# Patient Record
Sex: Female | Born: 2000 | Race: Black or African American | Hispanic: Yes | Smoking: Never smoker
Health system: Southern US, Community
[De-identification: ages and names within clinical notes are randomized; demographics above are authoritative.]

## PROBLEM LIST (undated history)

## (undated) ENCOUNTER — Inpatient Hospital Stay: Payer: Self-pay

## (undated) DIAGNOSIS — F99 Mental disorder, not otherwise specified: Secondary | ICD-10-CM

## (undated) DIAGNOSIS — D649 Anemia, unspecified: Secondary | ICD-10-CM

## (undated) HISTORY — PX: NO PAST SURGERIES: SHX2092

## (undated) HISTORY — DX: Anemia, unspecified: D64.9

## (undated) HISTORY — DX: Mental disorder, not otherwise specified: F99

## (undated) HISTORY — PX: OTHER SURGICAL HISTORY: SHX169

---

## 2021-07-05 ENCOUNTER — Ambulatory Visit (LOCAL_COMMUNITY_HEALTH_CENTER): Payer: Self-pay

## 2021-07-05 ENCOUNTER — Other Ambulatory Visit: Payer: Self-pay

## 2021-07-05 VITALS — BP 118/72 | Ht 63.5 in | Wt 98.0 lb

## 2021-07-05 DIAGNOSIS — Z3201 Encounter for pregnancy test, result positive: Secondary | ICD-10-CM

## 2021-07-05 LAB — PREGNANCY, URINE: Preg Test, Ur: POSITIVE — AB

## 2021-07-05 MED ORDER — PRENATAL 27-0.8 MG PO TABS: 1.0000 | ORAL_TABLET | Freq: Every day | ORAL | 0 refills | Status: DC

## 2021-07-05 NOTE — Progress Notes (Signed)
UPT positive. Plans prenatal care at [facility]. Pt reports moving to [place] from [place] 2 weeks ago with boyfriend and his mother. Reports hx depression, no meds, and no counselor locally. Had counselor in [place]. Hx of cutting self and says she has scars on arms. Hx sucidal attempt one year ago, and says "I cut myself the wrong way." Not hospitalized at that time. Reports last time she cut self was 3 months ago. PHQ 9 today. States suicidal thoughts over last 2 weeks without plan or intent to carry out. Consult Beatris Si, PA who agrees to speak with pt if pt agrees. Advises RN to offer contact card for A Mariana Kaufman, LCSW at [facility] and [employer] crisis hotline card and to have pt agree to call resources before acting on suicidal thoughts. RN offered that a provider could speak with her now, but she declines and states "I'm alright right now." RN carried out provider recommendations and pt agrees to call local behavioral health resources. Pt sent to clerk for preadmit. No NCIR on file. Jerel Shepherd, RN

## 2021-07-05 NOTE — Progress Notes (Signed)
Consulted by RN re: patient situation.  Reviewed RN note and agree that it reflects our discussion and my recommendations. 

## 2021-07-10 ENCOUNTER — Other Ambulatory Visit: Payer: Self-pay

## 2021-07-10 ENCOUNTER — Encounter: Payer: Self-pay | Admitting: Family Medicine

## 2021-07-10 ENCOUNTER — Ambulatory Visit: Payer: Medicaid Other | Admitting: Family Medicine

## 2021-07-10 VITALS — BP 119/73 | HR 85 | Temp 98.0°F | Wt 98.8 lb

## 2021-07-10 DIAGNOSIS — Z3401 Encounter for supervision of normal first pregnancy, first trimester: Secondary | ICD-10-CM

## 2021-07-10 DIAGNOSIS — Z3400 Encounter for supervision of normal first pregnancy, unspecified trimester: Secondary | ICD-10-CM

## 2021-07-10 DIAGNOSIS — Z8659 Personal history of other mental and behavioral disorders: Secondary | ICD-10-CM

## 2021-07-10 DIAGNOSIS — Z87891 Personal history of nicotine dependence: Secondary | ICD-10-CM

## 2021-07-10 DIAGNOSIS — F1910 Other psychoactive substance abuse, uncomplicated: Secondary | ICD-10-CM

## 2021-07-10 LAB — URINALYSIS
Bilirubin, UA: NEGATIVE
Glucose, UA: NEGATIVE
Ketones, UA: NEGATIVE
Leukocytes,UA: NEGATIVE
Nitrite, UA: NEGATIVE
Protein,UA: NEGATIVE
Specific Gravity, UA: 1.03 (ref 1.005–1.030)
Urobilinogen, Ur: 0.2 mg/dL (ref 0.2–1.0)
pH, UA: 6 (ref 5.0–7.5)

## 2021-07-10 LAB — WET PREP FOR TRICH, YEAST, CLUE
Trichomonas Exam: NEGATIVE
Yeast Exam: NEGATIVE

## 2021-07-10 LAB — HEMOGLOBIN, FINGERSTICK: Hemoglobin: 12.3 g/dL (ref 11.1–15.9)

## 2021-07-10 NOTE — Progress Notes (Signed)
UA, Hgb and wet mount results reviewed. Per standing orders no treatment indicated. Sinaya Minogue, RN  

## 2021-07-10 NOTE — Progress Notes (Addendum)
Here today for 11.4 week MH IP. Taking PNV QD. States went to a hospital in [place] one time (06/09/2021) before moving to [facility] 06/17/2021. ROI faxed for records. Tawny Hopping, RN
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
## 2021-07-11 DIAGNOSIS — Z2839 Other underimmunization status: Secondary | ICD-10-CM | POA: Insufficient documentation

## 2021-07-11 DIAGNOSIS — Z3400 Encounter for supervision of normal first pregnancy, unspecified trimester: Secondary | ICD-10-CM | POA: Insufficient documentation

## 2021-07-11 DIAGNOSIS — O09899 Supervision of other high risk pregnancies, unspecified trimester: Secondary | ICD-10-CM | POA: Insufficient documentation

## 2021-07-11 DIAGNOSIS — Z8659 Personal history of other mental and behavioral disorders: Secondary | ICD-10-CM | POA: Insufficient documentation

## 2021-07-11 LAB — CBC/D/PLT+RPR+RH+ABO+RUB AB...
Antibody Screen: NEGATIVE
Basophils Absolute: 0 10*3/uL (ref 0.0–0.2)
Basos: 0 %
EOS (ABSOLUTE): 0 10*3/uL (ref 0.0–0.4)
Eos: 0 %
Hematocrit: 39 % (ref 34.0–46.6)
Hemoglobin: 12.7 g/dL (ref 11.1–15.9)
Hepatitis B Surface Ag: NEGATIVE
Immature Grans (Abs): 0.1 10*3/uL (ref 0.0–0.1)
Immature Granulocytes: 1 %
Lymphocytes Absolute: 1.5 10*3/uL (ref 0.7–3.1)
Lymphs: 12 %
MCH: 29.3 pg (ref 26.6–33.0)
MCHC: 32.6 g/dL (ref 31.5–35.7)
MCV: 90 fL (ref 79–97)
Monocytes Absolute: 0.6 10*3/uL (ref 0.1–0.9)
Monocytes: 5 %
Neutrophils Absolute: 9.8 10*3/uL — ABNORMAL HIGH (ref 1.4–7.0)
Neutrophils: 82 %
Platelets: 441 10*3/uL (ref 150–450)
RBC: 4.34 x10E6/uL (ref 3.77–5.28)
RDW: 14.1 % (ref 11.7–15.4)
RPR Ser Ql: NONREACTIVE
Rh Factor: POSITIVE
Rubella Antibodies, IGG: 6.42 index (ref >0.99)
Varicella zoster IgG: <135 index — ABNORMAL LOW (ref >165)
WBC: 12.1 10*3/uL — ABNORMAL HIGH (ref 3.4–10.8)

## 2021-07-11 LAB — LEAD, BLOOD (ADULT >= 16 YRS): Lead-Whole Blood: <1 ug/dL (ref 0–4)

## 2021-07-11 LAB — HCV INTERPRETATION

## 2021-07-11 LAB — HCV AB W REFLEX TO QUANT PCR: HCV Ab: <0.1 s/co ratio (ref 0.0–0.9)

## 2021-07-12 LAB — 789231 7+OXYCODONE-BUND
Amphetamines, Urine: NEGATIVE ng/mL
BENZODIAZ UR QL: NEGATIVE ng/mL
Barbiturate screen, urine: NEGATIVE ng/mL
Cocaine (Metab.): NEGATIVE ng/mL
OPIATE SCREEN URINE: NEGATIVE ng/mL
Oxycodone/Oxymorphone, Urine: NEGATIVE ng/mL
PCP Quant, Ur: NEGATIVE ng/mL

## 2021-07-12 LAB — HGB FRACTIONATION CASCADE
Hgb A2: 2.6 % (ref 1.8–3.2)
Hgb A: 97.4 % (ref 96.4–98.8)
Hgb F: 0 % (ref 0.0–2.0)
Hgb S: 0 %

## 2021-07-12 LAB — CANNABINOID CONFIRMATION, UR
CANNABINOIDS: POSITIVE — AB
Carboxy THC GC/MS Conf: 25 ng/mL

## 2021-07-12 LAB — HIV-1/HIV-2 QUALITATIVE RNA
HIV-1 RNA, Qualitative: NONREACTIVE
HIV-2 RNA, Qualitative: NONREACTIVE

## 2021-07-12 LAB — URINE CULTURE

## 2021-07-13 DIAGNOSIS — Z87891 Personal history of nicotine dependence: Secondary | ICD-10-CM | POA: Insufficient documentation

## 2021-07-13 DIAGNOSIS — F1910 Other psychoactive substance abuse, uncomplicated: Secondary | ICD-10-CM | POA: Insufficient documentation

## 2021-07-13 LAB — CHLAMYDIA/GC NAA, CONFIRMATION
Chlamydia trachomatis, NAA: NEGATIVE
Neisseria gonorrhoeae, NAA: NEGATIVE

## 2021-07-24 NOTE — Addendum Note (Signed)
Addended by: Heywood Bene on: 07/24/2021 02:13 PM   Modules accepted: Orders

## 2021-08-07 ENCOUNTER — Ambulatory Visit: Payer: Self-pay

## 2021-08-07 ENCOUNTER — Ambulatory Visit: Payer: Medicaid Other

## 2021-08-08 ENCOUNTER — Ambulatory Visit: Payer: Medicaid Other | Admitting: Advanced Practice Midwife

## 2021-08-08 ENCOUNTER — Other Ambulatory Visit: Payer: Self-pay

## 2021-08-08 VITALS — BP 113/69 | HR 102 | Temp 98.1°F | Wt 103.0 lb

## 2021-08-08 DIAGNOSIS — Z87891 Personal history of nicotine dependence: Secondary | ICD-10-CM

## 2021-08-08 DIAGNOSIS — Z3400 Encounter for supervision of normal first pregnancy, unspecified trimester: Secondary | ICD-10-CM

## 2021-08-08 DIAGNOSIS — R825 Elevated urine levels of drugs, medicaments and biological substances: Secondary | ICD-10-CM | POA: Insufficient documentation

## 2021-08-08 DIAGNOSIS — Z8659 Personal history of other mental and behavioral disorders: Secondary | ICD-10-CM

## 2021-08-08 LAB — URINALYSIS
Bilirubin, UA: NEGATIVE
Glucose, UA: NEGATIVE
Ketones, UA: NEGATIVE
Leukocytes,UA: NEGATIVE
Nitrite, UA: NEGATIVE
Protein,UA: NEGATIVE
RBC, UA: NEGATIVE
Specific Gravity, UA: 1.025 (ref 1.005–1.030)
Urobilinogen, Ur: 1 mg/dL (ref 0.2–1.0)
pH, UA: 7 (ref 5.0–7.5)

## 2021-08-08 NOTE — Progress Notes (Signed)
Patient counseled to expect TC with U/S date and time.Burt Knack, RN
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
## 2021-08-08 NOTE — Progress Notes (Signed)
Patient here for MH RV at 15 5/7. Quad declination signed. BCM pamphlet and Peds list given. Patient counseled varicella non-immune and literature given.Burt Knack, RN

## 2021-08-08 NOTE — Progress Notes (Signed)
Urine dip reviewed by E. Sciora CNM and no new orders given. Darrol Poke MSW, OBCM in room talking with client regarding transportation and other concerns. Jossie Ng, RN

## 2021-08-09 ENCOUNTER — Telehealth: Payer: Self-pay

## 2021-08-09 LAB — 789231 7+OXYCODONE-BUND
Amphetamines, Urine: NEGATIVE ng/mL
BENZODIAZ UR QL: NEGATIVE ng/mL
Barbiturate screen, urine: NEGATIVE ng/mL
Cannabinoid Quant, Ur: NEGATIVE ng/mL
Cocaine (Metab.): NEGATIVE ng/mL
OPIATE SCREEN URINE: NEGATIVE ng/mL
Oxycodone/Oxymorphone, Urine: NEGATIVE ng/mL
PCP Quant, Ur: NEGATIVE ng/mL

## 2021-08-09 NOTE — Telephone Encounter (Signed)
Call to client with [facility] anatomy US appt on 09/06/21 with arrival time of 1245. Verbal directions to facility provided. Jossie Ng, RN

## 2021-09-05 ENCOUNTER — Other Ambulatory Visit: Payer: Self-pay | Admitting: Advanced Practice Midwife

## 2021-09-05 ENCOUNTER — Other Ambulatory Visit: Payer: Self-pay

## 2021-09-05 ENCOUNTER — Ambulatory Visit: Payer: Self-pay | Admitting: Advanced Practice Midwife

## 2021-09-05 VITALS — BP 113/67 | HR 87 | Temp 97.1°F | Wt 113.6 lb

## 2021-09-05 DIAGNOSIS — R825 Elevated urine levels of drugs, medicaments and biological substances: Secondary | ICD-10-CM

## 2021-09-05 DIAGNOSIS — Z3402 Encounter for supervision of normal first pregnancy, second trimester: Secondary | ICD-10-CM

## 2021-09-05 DIAGNOSIS — Z3400 Encounter for supervision of normal first pregnancy, unspecified trimester: Secondary | ICD-10-CM

## 2021-09-05 DIAGNOSIS — Z5900 Homelessness unspecified: Secondary | ICD-10-CM

## 2021-09-05 DIAGNOSIS — O9A319 Physical abuse complicating pregnancy, unspecified trimester: Secondary | ICD-10-CM

## 2021-09-05 DIAGNOSIS — Z3689 Encounter for other specified antenatal screening: Secondary | ICD-10-CM

## 2021-09-05 DIAGNOSIS — Z59819 Housing instability, housed unspecified: Secondary | ICD-10-CM | POA: Insufficient documentation

## 2021-09-05 DIAGNOSIS — Z8659 Personal history of other mental and behavioral disorders: Secondary | ICD-10-CM

## 2021-09-05 DIAGNOSIS — O9A312 Physical abuse complicating pregnancy, second trimester: Secondary | ICD-10-CM

## 2021-09-05 DIAGNOSIS — Z87891 Personal history of nicotine dependence: Secondary | ICD-10-CM

## 2021-09-05 LAB — URINALYSIS
Bilirubin, UA: NEGATIVE
Glucose, UA: NEGATIVE
Nitrite, UA: POSITIVE — AB
Protein,UA: NEGATIVE
Specific Gravity, UA: 1.02 (ref 1.005–1.030)
Urobilinogen, Ur: 0.2 mg/dL (ref 0.2–1.0)
pH, UA: 7 (ref 5.0–7.5)

## 2021-09-05 MED ORDER — PRENATAL VITAMIN 27-0.8 MG PO TABS: 1.0000 | ORAL_TABLET | Freq: Every day | ORAL | 0 refills | Status: DC
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
## 2021-09-06 ENCOUNTER — Emergency Department: Admission: EM | Admit: 2021-09-06 | Discharge: 2021-09-06 | Discharge status: Absent without leave | Payer: Self-pay

## 2021-09-06 ENCOUNTER — Ambulatory Visit
Admission: RE | Admit: 2021-09-06 | Discharge: 2021-09-06 | Disposition: A | Discharge status: Home / Self Care | Payer: Medicaid Other | Source: Ambulatory Visit | Attending: Advanced Practice Midwife | Admitting: Advanced Practice Midwife

## 2021-09-06 DIAGNOSIS — Z3689 Encounter for other specified antenatal screening: Secondary | ICD-10-CM | POA: Diagnosis present

## 2021-09-06 DIAGNOSIS — Z3400 Encounter for supervision of normal first pregnancy, unspecified trimester: Secondary | ICD-10-CM | POA: Insufficient documentation

## 2021-09-06 LAB — 789231 7+OXYCODONE-BUND
Amphetamines, Urine: NEGATIVE ng/mL
BENZODIAZ UR QL: NEGATIVE ng/mL
Barbiturate screen, urine: NEGATIVE ng/mL
Cannabinoid Quant, Ur: NEGATIVE ng/mL
Cocaine (Metab.): NEGATIVE ng/mL
OPIATE SCREEN URINE: NEGATIVE ng/mL
Oxycodone/Oxymorphone, Urine: NEGATIVE ng/mL
PCP Quant, Ur: NEGATIVE ng/mL

## 2021-09-10 ENCOUNTER — Encounter: Payer: Self-pay | Admitting: Advanced Practice Midwife

## 2021-09-10 ENCOUNTER — Telehealth: Payer: Self-pay

## 2021-09-10 DIAGNOSIS — O2340 Unspecified infection of urinary tract in pregnancy, unspecified trimester: Secondary | ICD-10-CM | POA: Insufficient documentation

## 2021-09-10 LAB — URINE CULTURE

## 2021-09-10 NOTE — Telephone Encounter (Signed)
Call to client as needs appt for UTI treatment. Per client, can come to clinic tomorrow pm after work. Appt scheduled for 2:40 pm. Jossie Ng, RN

## 2021-09-12 ENCOUNTER — Other Ambulatory Visit: Payer: Self-pay

## 2021-09-12 ENCOUNTER — Ambulatory Visit: Payer: Self-pay | Admitting: Advanced Practice Midwife

## 2021-09-12 VITALS — BP 108/65 | HR 75 | Temp 97.8°F | Wt 118.2 lb

## 2021-09-12 DIAGNOSIS — Z3400 Encounter for supervision of normal first pregnancy, unspecified trimester: Secondary | ICD-10-CM

## 2021-09-12 DIAGNOSIS — O2340 Unspecified infection of urinary tract in pregnancy, unspecified trimester: Secondary | ICD-10-CM

## 2021-09-12 MED ORDER — NITROFURANTOIN MONOHYD MACRO 100 MG PO CAPS: 100.0000 mg | ORAL_CAPSULE | Freq: Two times a day (BID) | ORAL | 0 refills | Status: DC
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
## 2021-09-12 NOTE — Progress Notes (Signed)
TC to R. Marlan Palau who came to talk with patient and is following up with her regarding Medicaid application. Note for work given and copy sent for scanning. Patient aware of next appointment.Burt Knack, RN

## 2021-09-12 NOTE — Progress Notes (Signed)
Here at 20 5/7 for UTI retreatment. Counseled to see Ike Bene about Medicaid and applying. Declines flu vaccine today. Still unsure about peds, feeding and BCM.Marland KitchenBurt Knack, RN

## 2021-09-17 ENCOUNTER — Other Ambulatory Visit: Payer: Self-pay | Admitting: Advanced Practice Midwife

## 2021-09-17 DIAGNOSIS — Z3400 Encounter for supervision of normal first pregnancy, unspecified trimester: Secondary | ICD-10-CM

## 2021-09-17 NOTE — Progress Notes (Signed)
Reviewed 09/06/21 u/s at 19 1/7. Norva Pavlov, MD recommends f/u u/s brain anatomy due to poor visualization. Pt does not have insurance or Medicaid.

## 2021-09-18 ENCOUNTER — Other Ambulatory Visit: Payer: Self-pay | Admitting: Advanced Practice Midwife

## 2021-09-18 DIAGNOSIS — Z3400 Encounter for supervision of normal first pregnancy, unspecified trimester: Secondary | ICD-10-CM

## 2021-09-19 ENCOUNTER — Telehealth: Payer: Self-pay

## 2021-09-19 NOTE — Telephone Encounter (Signed)
Client needs follow-up US due to incomplete visualization of brain on prior scan [facility]). Follow-up US ordered at [facility] and scheduled for 10/23/2021 at 1500 with arrival time of 1445. Client provided verbal directions to facility. Jossie Ng, RN

## 2021-09-21 NOTE — Addendum Note (Signed)
Addended by: Heywood Bene on: 09/21/2021 10:44 AM   Modules accepted: Orders

## 2021-10-03 ENCOUNTER — Ambulatory Visit: Payer: Self-pay | Admitting: Advanced Practice Midwife

## 2021-10-03 ENCOUNTER — Other Ambulatory Visit: Payer: Self-pay

## 2021-10-03 VITALS — BP 115/74 | HR 101 | Temp 97.6°F | Wt 125.0 lb

## 2021-10-03 DIAGNOSIS — O9A319 Physical abuse complicating pregnancy, unspecified trimester: Secondary | ICD-10-CM

## 2021-10-03 DIAGNOSIS — Z3400 Encounter for supervision of normal first pregnancy, unspecified trimester: Secondary | ICD-10-CM

## 2021-10-03 DIAGNOSIS — O2340 Unspecified infection of urinary tract in pregnancy, unspecified trimester: Secondary | ICD-10-CM

## 2021-10-03 DIAGNOSIS — Z87891 Personal history of nicotine dependence: Secondary | ICD-10-CM

## 2021-10-03 DIAGNOSIS — R825 Elevated urine levels of drugs, medicaments and biological substances: Secondary | ICD-10-CM

## 2021-10-03 DIAGNOSIS — Z5900 Homelessness unspecified: Secondary | ICD-10-CM

## 2021-10-03 DIAGNOSIS — Z8659 Personal history of other mental and behavioral disorders: Secondary | ICD-10-CM

## 2021-10-03 NOTE — Progress Notes (Addendum)
Patient here for MH RV at 23 5/7. S/S PTL reviewed and literature given. CMHRP and PHQ9 today. Needs urine TOC today, states she has finished macrobid. Patient aware of 10/23/2021 [facility] U/S.Marland KitchenBurt Knack, RN
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
## 2021-10-04 ENCOUNTER — Telehealth: Payer: Self-pay | Admitting: Licensed Clinical Social Worker

## 2021-10-04 LAB — 789231 7+OXYCODONE-BUND
Amphetamines, Urine: NEGATIVE ng/mL
BENZODIAZ UR QL: NEGATIVE ng/mL
Barbiturate screen, urine: NEGATIVE ng/mL
Cannabinoid Quant, Ur: NEGATIVE ng/mL
Cocaine (Metab.): NEGATIVE ng/mL
OPIATE SCREEN URINE: NEGATIVE ng/mL
Oxycodone/Oxymorphone, Urine: NEGATIVE ng/mL
PCP Quant, Ur: NEGATIVE ng/mL

## 2021-10-04 NOTE — Telephone Encounter (Signed)
-----   Message from Ellison Carwin sent at 10/04/2021  9:10 AM EST ----- Regarding: New referral Morning Ma'am. Hope all is well.  If you don't mind would you reach out and keep this member on your radar.  She reports no mental health needs and is not too open to working with you but she scored a 8 on her PHQ-9.Marland Kitchen Therefore, if you don't mind just doing a quick call now, to let her know that you are available for any future mental health needs, as well as, a f/u for when she delivers, it would be greatly appreciated. Thanks,

## 2021-10-08 LAB — URINE CULTURE

## 2021-10-18 ENCOUNTER — Other Ambulatory Visit: Payer: Self-pay

## 2021-10-18 ENCOUNTER — Encounter: Payer: Self-pay | Admitting: Advanced Practice Midwife

## 2021-10-18 ENCOUNTER — Ambulatory Visit: Payer: Medicaid Other | Attending: Obstetrics and Gynecology

## 2021-10-18 ENCOUNTER — Other Ambulatory Visit: Payer: Self-pay | Admitting: Advanced Practice Midwife

## 2021-10-18 DIAGNOSIS — Z5900 Homelessness unspecified: Secondary | ICD-10-CM

## 2021-10-18 DIAGNOSIS — O358XX0 Maternal care for other (suspected) fetal abnormality and damage, not applicable or unspecified: Secondary | ICD-10-CM

## 2021-10-18 DIAGNOSIS — Z3400 Encounter for supervision of normal first pregnancy, unspecified trimester: Secondary | ICD-10-CM

## 2021-10-18 DIAGNOSIS — O99322 Drug use complicating pregnancy, second trimester: Secondary | ICD-10-CM

## 2021-10-18 DIAGNOSIS — Z3A25 25 weeks gestation of pregnancy: Secondary | ICD-10-CM | POA: Diagnosis not present

## 2021-10-18 DIAGNOSIS — Z3402 Encounter for supervision of normal first pregnancy, second trimester: Secondary | ICD-10-CM | POA: Diagnosis not present

## 2021-10-18 DIAGNOSIS — O365929 Maternal care for other known or suspected poor fetal growth, second trimester, other fetus: Secondary | ICD-10-CM | POA: Insufficient documentation

## 2021-10-18 DIAGNOSIS — O35BXX0 Maternal care for other (suspected) fetal abnormality and damage, fetal cardiac anomalies, not applicable or unspecified: Secondary | ICD-10-CM | POA: Insufficient documentation

## 2021-10-18 DIAGNOSIS — Z363 Encounter for antenatal screening for malformations: Secondary | ICD-10-CM | POA: Insufficient documentation

## 2021-10-23 ENCOUNTER — Ambulatory Visit: Payer: Self-pay

## 2021-10-31 ENCOUNTER — Ambulatory Visit: Payer: Medicaid Other | Admitting: Physician Assistant

## 2021-10-31 ENCOUNTER — Other Ambulatory Visit: Payer: Self-pay

## 2021-10-31 VITALS — BP 107/66 | HR 90 | Temp 99.0°F | Wt 128.6 lb

## 2021-10-31 DIAGNOSIS — Z23 Encounter for immunization: Secondary | ICD-10-CM

## 2021-10-31 DIAGNOSIS — R825 Elevated urine levels of drugs, medicaments and biological substances: Secondary | ICD-10-CM

## 2021-10-31 DIAGNOSIS — O9A313 Physical abuse complicating pregnancy, third trimester: Secondary | ICD-10-CM

## 2021-10-31 DIAGNOSIS — Z8659 Personal history of other mental and behavioral disorders: Secondary | ICD-10-CM

## 2021-10-31 DIAGNOSIS — O9A319 Physical abuse complicating pregnancy, unspecified trimester: Secondary | ICD-10-CM

## 2021-10-31 DIAGNOSIS — Z3403 Encounter for supervision of normal first pregnancy, third trimester: Secondary | ICD-10-CM

## 2021-10-31 DIAGNOSIS — Z5900 Homelessness unspecified: Secondary | ICD-10-CM

## 2021-10-31 DIAGNOSIS — Z3400 Encounter for supervision of normal first pregnancy, unspecified trimester: Secondary | ICD-10-CM

## 2021-10-31 LAB — HEMOGLOBIN, FINGERSTICK: Hemoglobin: 12.2 g/dL (ref 11.1–15.9)
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
## 2021-10-31 NOTE — Progress Notes (Signed)
Patient here for MH RV at 27 5/7. 28 week labs today, 1 hour gtt and Tdap. Tdap given, left deltoid, tolerated well, VIS given. Patient needs to be to lab by 11:15 for 11:23 blood draw. Burt Knack, RN

## 2021-11-02 ENCOUNTER — Encounter: Payer: Self-pay | Admitting: Obstetrics and Gynecology

## 2021-11-02 ENCOUNTER — Other Ambulatory Visit: Payer: Self-pay

## 2021-11-02 ENCOUNTER — Observation Stay
Admission: EM | Admit: 2021-11-02 | Discharge: 2021-11-02 | Disposition: A | Discharge status: Home / Self Care | Payer: Medicaid Other | Attending: Obstetrics and Gynecology | Admitting: Obstetrics and Gynecology

## 2021-11-02 DIAGNOSIS — O26892 Other specified pregnancy related conditions, second trimester: Secondary | ICD-10-CM | POA: Diagnosis present

## 2021-11-02 DIAGNOSIS — R300 Dysuria: Secondary | ICD-10-CM | POA: Diagnosis not present

## 2021-11-02 DIAGNOSIS — Z3A28 28 weeks gestation of pregnancy: Secondary | ICD-10-CM | POA: Insufficient documentation

## 2021-11-02 DIAGNOSIS — O26893 Other specified pregnancy related conditions, third trimester: Secondary | ICD-10-CM | POA: Diagnosis present

## 2021-11-02 DIAGNOSIS — R109 Unspecified abdominal pain: Secondary | ICD-10-CM | POA: Diagnosis present

## 2021-11-02 LAB — HIV-1/HIV-2 QUALITATIVE RNA
HIV-1 RNA, Qualitative: NONREACTIVE
HIV-2 RNA, Qualitative: NONREACTIVE

## 2021-11-02 LAB — URINALYSIS, COMPLETE (UACMP) WITH MICROSCOPIC
Bilirubin Urine: NEGATIVE
Glucose, UA: NEGATIVE mg/dL
Ketones, ur: 80 mg/dL — AB
Nitrite: POSITIVE — AB
Protein, ur: 30 mg/dL — AB
Specific Gravity, Urine: 1.019 (ref 1.005–1.030)
WBC, UA: >50 WBC/hpf — ABNORMAL HIGH (ref 0–5)
pH: 5 (ref 5.0–8.0)

## 2021-11-02 LAB — GLUCOSE, 1 HOUR GESTATIONAL: Gestational Diabetes Screen: 102 mg/dL (ref 70–139)

## 2021-11-02 LAB — RPR: RPR Ser Ql: NONREACTIVE

## 2021-11-02 MED ORDER — SODIUM CHLORIDE 0.9 % IV SOLN
1.0000 g | Freq: Once | INTRAVENOUS | Status: AC
  Administered 2021-11-02: 1 g via INTRAVENOUS
  Filled 2021-11-02: qty 1

## 2021-11-02 MED ORDER — NITROFURANTOIN MONOHYD MACRO 100 MG PO CAPS: 100.0000 mg | ORAL_CAPSULE | Freq: Two times a day (BID) | ORAL | 0 refills | Status: AC

## 2021-11-02 MED ORDER — LACTATED RINGERS IV BOLUS
1000.0000 mL | Freq: Once | INTRAVENOUS | Status: AC
  Administered 2021-11-02: 1000 mL via INTRAVENOUS

## 2021-11-02 MED ORDER — ACETAMINOPHEN 500 MG PO TABS
1000.0000 mg | ORAL_TABLET | Freq: Four times a day (QID) | ORAL | Status: DC | PRN
  Administered 2021-11-02: 1000 mg via ORAL
  Filled 2021-11-02: qty 2

## 2021-11-02 NOTE — Final Progress Note (Signed)
L&D OB Triage Note  Pamela Friedman is a 20 y.o. G1P0000 female at [redacted]w[redacted]d, EDD Estimated Date of Delivery: 01/25/22 who presented to triage for complaints of abdominal pain ongoing for 1 day.  Also noting some mild dysuria.  She currently receives prenatal care care at [facility]. She was evaluated by the nurses. Vital signs stable but noting low-grade fever. An NST was performed and has been reviewed by MD.    NST INTERPRETATION: Indications: rule out uterine contractions  Mode: External Baseline Rate (A): 165 bpm Variability: Moderate Accelerations: 15 x 15 Decelerations: None     Contraction Frequency (min): none  Impression: reactive   Labs:  Results for orders placed or performed during the hospital encounter of 11/02/21  Urinalysis, Complete w Microscopic  Result Value Ref Range   Color, Urine YELLOW (A) YELLOW   APPearance TURBID (A) CLEAR   Specific Gravity, Urine 1.019 1.005 - 1.030   pH 5.0 5.0 - 8.0   Glucose, UA NEGATIVE NEGATIVE mg/dL   Hgb urine dipstick SMALL (A) NEGATIVE   Bilirubin Urine NEGATIVE NEGATIVE   Ketones, ur 80 (A) NEGATIVE mg/dL   Protein, ur 30 (A) NEGATIVE mg/dL   Nitrite POSITIVE (A) NEGATIVE   Leukocytes,Ua LARGE (A) NEGATIVE   RBC / HPF 6-10 0 - 5 RBC/hpf   WBC, UA >50 (H) 0 - 5 WBC/hpf   Bacteria, UA FEW (A) NONE SEEN   Squamous Epithelial / LPF 21-50 0 - 5   WBC Clumps PRESENT    Mucus PRESENT      Plan:  - NST performed was reviewed and was found to be reactive.  - She was diagnosed with a UTI.  Treated with IV dose of Rocephin 1 gram, Also given Tylenol for pain and low-grade temp.  - She was discharged home with a prescription for Macrobid.  - Continue routine prenatal care. Follow up with OB/GYN as previously scheduled.     Hildred Laser, MD  [facility]

## 2021-11-02 NOTE — OB Triage Note (Signed)
Discharge instructions and prescriptions reviewed and pt verbalized understanding. Pt discharged with family. Pt stable at the time of discharge.

## 2021-11-05 LAB — URINE CULTURE: Culture: >=100000 — AB

## 2021-11-08 ENCOUNTER — Ambulatory Visit: Payer: Medicaid Other | Admitting: *Deleted

## 2021-11-08 ENCOUNTER — Other Ambulatory Visit: Payer: Self-pay | Admitting: *Deleted

## 2021-11-08 ENCOUNTER — Other Ambulatory Visit: Payer: Self-pay

## 2021-11-08 ENCOUNTER — Ambulatory Visit: Payer: Medicaid Other | Attending: Obstetrics and Gynecology

## 2021-11-08 VITALS — BP 113/64 | HR 92

## 2021-11-08 DIAGNOSIS — O35BXX0 Maternal care for other (suspected) fetal abnormality and damage, fetal cardiac anomalies, not applicable or unspecified: Secondary | ICD-10-CM

## 2021-11-08 DIAGNOSIS — Z3A28 28 weeks gestation of pregnancy: Secondary | ICD-10-CM

## 2021-11-08 DIAGNOSIS — Z3689 Encounter for other specified antenatal screening: Secondary | ICD-10-CM

## 2021-11-08 DIAGNOSIS — O09899 Supervision of other high risk pregnancies, unspecified trimester: Secondary | ICD-10-CM | POA: Insufficient documentation

## 2021-11-08 DIAGNOSIS — O365931 Maternal care for other known or suspected poor fetal growth, third trimester, fetus 1: Secondary | ICD-10-CM

## 2021-11-08 DIAGNOSIS — O99324 Drug use complicating childbirth: Secondary | ICD-10-CM

## 2021-11-08 DIAGNOSIS — Z2839 Other underimmunization status: Secondary | ICD-10-CM | POA: Insufficient documentation

## 2021-11-08 DIAGNOSIS — O283 Abnormal ultrasonic finding on antenatal screening of mother: Secondary | ICD-10-CM

## 2021-11-14 ENCOUNTER — Ambulatory Visit: Payer: Medicaid Other

## 2021-11-15 ENCOUNTER — Ambulatory Visit: Payer: Medicaid Other

## 2021-11-15 ENCOUNTER — Other Ambulatory Visit: Payer: Self-pay

## 2021-11-21 ENCOUNTER — Encounter: Payer: Self-pay | Admitting: Nurse Practitioner

## 2021-11-21 ENCOUNTER — Ambulatory Visit: Payer: Medicaid Other | Admitting: Nurse Practitioner

## 2021-11-21 ENCOUNTER — Encounter: Payer: Self-pay | Admitting: Advanced Practice Midwife

## 2021-11-21 ENCOUNTER — Other Ambulatory Visit: Payer: Self-pay

## 2021-11-21 VITALS — BP 113/65 | HR 90 | Temp 98.2°F | Wt 133.4 lb

## 2021-11-21 DIAGNOSIS — R825 Elevated urine levels of drugs, medicaments and biological substances: Secondary | ICD-10-CM

## 2021-11-21 DIAGNOSIS — O9A319 Physical abuse complicating pregnancy, unspecified trimester: Secondary | ICD-10-CM

## 2021-11-21 DIAGNOSIS — N390 Urinary tract infection, site not specified: Secondary | ICD-10-CM | POA: Insufficient documentation

## 2021-11-21 DIAGNOSIS — Z8659 Personal history of other mental and behavioral disorders: Secondary | ICD-10-CM

## 2021-11-21 DIAGNOSIS — Z3403 Encounter for supervision of normal first pregnancy, third trimester: Secondary | ICD-10-CM

## 2021-11-21 DIAGNOSIS — Z3400 Encounter for supervision of normal first pregnancy, unspecified trimester: Secondary | ICD-10-CM

## 2021-11-21 DIAGNOSIS — O2343 Unspecified infection of urinary tract in pregnancy, third trimester: Secondary | ICD-10-CM

## 2021-11-21 DIAGNOSIS — O365929 Maternal care for other known or suspected poor fetal growth, second trimester, other fetus: Secondary | ICD-10-CM

## 2021-11-21 DIAGNOSIS — O2340 Unspecified infection of urinary tract in pregnancy, unspecified trimester: Secondary | ICD-10-CM

## 2021-11-21 DIAGNOSIS — Z5900 Homelessness unspecified: Secondary | ICD-10-CM

## 2021-11-21 LAB — URINALYSIS
Bilirubin, UA: NEGATIVE
Glucose, UA: NEGATIVE
Ketones, UA: NEGATIVE
Leukocytes,UA: NEGATIVE
Nitrite, UA: NEGATIVE
Protein,UA: NEGATIVE
RBC, UA: NEGATIVE
Specific Gravity, UA: 1.02 (ref 1.005–1.030)
Urobilinogen, Ur: 0.2 mg/dL (ref 0.2–1.0)
pH, UA: 7 (ref 5.0–7.5)

## 2021-11-21 NOTE — Progress Notes (Signed)
Urine dip reviewed by Glenna Fellows FNP and no new orders provided. Jossie Ng, RN

## 2021-11-21 NOTE — Progress Notes (Signed)
Patient here today for her MHRV. Flu vaccine declined today/counseled regarding Flu vaccine and recommendations.
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
## 2021-11-22 NOTE — Addendum Note (Signed)
Addended by: Heywood Bene on: 11/22/2021 01:24 PM   Modules accepted: Orders

## 2021-11-23 ENCOUNTER — Other Ambulatory Visit: Payer: Self-pay

## 2021-11-23 ENCOUNTER — Observation Stay
Admission: EM | Admit: 2021-11-23 | Discharge: 2021-11-23 | Disposition: A | Discharge status: Home / Self Care | Payer: Medicaid Other | Attending: Obstetrics and Gynecology | Admitting: Obstetrics and Gynecology

## 2021-11-23 ENCOUNTER — Encounter: Payer: Self-pay | Admitting: Obstetrics and Gynecology

## 2021-11-23 DIAGNOSIS — Z20822 Contact with and (suspected) exposure to covid-19: Secondary | ICD-10-CM | POA: Insufficient documentation

## 2021-11-23 DIAGNOSIS — Z2839 Other underimmunization status: Secondary | ICD-10-CM

## 2021-11-23 DIAGNOSIS — O09899 Supervision of other high risk pregnancies, unspecified trimester: Secondary | ICD-10-CM

## 2021-11-23 DIAGNOSIS — Z3A31 31 weeks gestation of pregnancy: Secondary | ICD-10-CM | POA: Insufficient documentation

## 2021-11-23 DIAGNOSIS — O26893 Other specified pregnancy related conditions, third trimester: Secondary | ICD-10-CM | POA: Diagnosis present

## 2021-11-23 DIAGNOSIS — N3001 Acute cystitis with hematuria: Secondary | ICD-10-CM | POA: Diagnosis not present

## 2021-11-23 DIAGNOSIS — O2333 Infections of other parts of urinary tract in pregnancy, third trimester: Secondary | ICD-10-CM

## 2021-11-23 DIAGNOSIS — O2343 Unspecified infection of urinary tract in pregnancy, third trimester: Principal | ICD-10-CM | POA: Insufficient documentation

## 2021-11-23 DIAGNOSIS — R109 Unspecified abdominal pain: Secondary | ICD-10-CM | POA: Diagnosis present

## 2021-11-23 LAB — URINALYSIS, ROUTINE W REFLEX MICROSCOPIC
Bilirubin Urine: NEGATIVE
Glucose, UA: NEGATIVE mg/dL
Ketones, ur: NEGATIVE mg/dL
Nitrite: POSITIVE — AB
Protein, ur: 100 mg/dL — AB
RBC / HPF: >50 RBC/hpf — ABNORMAL HIGH (ref 0–5)
Specific Gravity, Urine: 1.016 (ref 1.005–1.030)
WBC, UA: >50 WBC/hpf — ABNORMAL HIGH (ref 0–5)
pH: 7 (ref 5.0–8.0)

## 2021-11-23 LAB — RESP PANEL BY RT-PCR (FLU A&B, COVID) ARPGX2
Influenza A by PCR: NEGATIVE
Influenza B by PCR: NEGATIVE
SARS Coronavirus 2 by RT PCR: NEGATIVE

## 2021-11-23 MED ORDER — LACTATED RINGERS IV BOLUS
1000.0000 mL | Freq: Once | INTRAVENOUS | Status: AC
  Administered 2021-11-23: 1000 mL via INTRAVENOUS

## 2021-11-23 MED ORDER — NITROFURANTOIN MONOHYD MACRO 100 MG PO CAPS
100.0000 mg | ORAL_CAPSULE | Freq: Two times a day (BID) | ORAL | Status: DC
  Administered 2021-11-23: 100 mg via ORAL
  Filled 2021-11-23: qty 1

## 2021-11-23 MED ORDER — NITROFURANTOIN MONOHYD MACRO 100 MG PO CAPS: 100.0000 mg | ORAL_CAPSULE | Freq: Two times a day (BID) | ORAL | 0 refills | Status: AC

## 2021-11-23 NOTE — OB Triage Note (Signed)
Patient arrived with complaints of  constant lower abdominal pain 8/10 that is "stabbing, sharp, and crampy" that started yesterday around 1200. Pt states baby is moving well. Denies LOF, vaginal bleeding, or any other concerns. Patient states she is drink and eating well.  Patients support person here and states he was tested for covid yesterday and awaiting results do to being symptomatic, pt denies any covid symptoms.

## 2021-11-24 LAB — URINE CULTURE

## 2021-11-24 NOTE — Discharge Summary (Signed)
. ° ° °  L&D OB Triage Note  SUBJECTIVE Pamela Friedman is a 21 y.o. G1P0000 female at [redacted]w[redacted]d, EDD Estimated Date of Delivery: 01/25/22 who presented to triage with complaints of constant lower abdominal pain.    OB History  Gravida Para Term Preterm AB Living  1 0 0 0 0 0  SAB IAB Ectopic Multiple Live Births  0 0 0 0 0    # Outcome Date GA Lbr Len/2nd Weight Sex Delivery Anes PTL Lv  1 Current             No medications prior to admission.     OBJECTIVE  Nursing Evaluation:   BP 119/65 (BP Location: Right Arm)    Pulse 94    Temp 97.7 F (36.5 C) (Oral)    Resp 18    LMP 04/20/2021 (Within Days)    Findings:        UA consistent with urinary tract infection.     Patient is not in labor      NST was performed and has been reviewed by me.  NST INTERPRETATION: Category I  Mode: External Baseline Rate (A): 160 bpm Variability: Moderate Accelerations: 10 x 10 Decelerations: None     Contraction Frequency (min): occasional with IU  ASSESSMENT Impression:  1.  Pregnancy:  G1P0000 at [redacted]w[redacted]d , EDD Estimated Date of Delivery: 01/25/22 2.  Reassuring fetal and maternal status 3.  UTI  PLAN 1. Current condition and above findings reviewed.  Reassuring fetal and maternal condition. 2. Discharge home with standard labor precautions given to return to L&D or call the office for problems. 3. Continue routine prenatal care. 4.  Prescription for Macrobid given as treatment for urinary tract infection.

## 2021-11-29 ENCOUNTER — Encounter: Payer: Self-pay | Admitting: Family Medicine

## 2021-12-05 ENCOUNTER — Other Ambulatory Visit: Payer: Self-pay

## 2021-12-05 ENCOUNTER — Ambulatory Visit: Payer: Medicaid Other | Admitting: Nurse Practitioner

## 2021-12-05 ENCOUNTER — Encounter: Payer: Self-pay | Admitting: Nurse Practitioner

## 2021-12-05 VITALS — BP 114/64 | HR 100 | Temp 98.0°F | Wt 136.0 lb

## 2021-12-05 DIAGNOSIS — O2343 Unspecified infection of urinary tract in pregnancy, third trimester: Secondary | ICD-10-CM

## 2021-12-05 DIAGNOSIS — Z8659 Personal history of other mental and behavioral disorders: Secondary | ICD-10-CM

## 2021-12-05 DIAGNOSIS — Z59819 Housing instability, housed unspecified: Secondary | ICD-10-CM

## 2021-12-05 DIAGNOSIS — O9A319 Physical abuse complicating pregnancy, unspecified trimester: Secondary | ICD-10-CM

## 2021-12-05 DIAGNOSIS — O2340 Unspecified infection of urinary tract in pregnancy, unspecified trimester: Secondary | ICD-10-CM

## 2021-12-05 DIAGNOSIS — Z3403 Encounter for supervision of normal first pregnancy, third trimester: Secondary | ICD-10-CM

## 2021-12-05 DIAGNOSIS — R825 Elevated urine levels of drugs, medicaments and biological substances: Secondary | ICD-10-CM

## 2021-12-05 DIAGNOSIS — O365929 Maternal care for other known or suspected poor fetal growth, second trimester, other fetus: Secondary | ICD-10-CM

## 2021-12-05 DIAGNOSIS — Z3400 Encounter for supervision of normal first pregnancy, unspecified trimester: Secondary | ICD-10-CM

## 2021-12-05 LAB — URINALYSIS
Bilirubin, UA: NEGATIVE
Glucose, UA: NEGATIVE
Ketones, UA: NEGATIVE
Nitrite, UA: NEGATIVE
Protein,UA: NEGATIVE
RBC, UA: NEGATIVE
Specific Gravity, UA: 1.01 (ref 1.005–1.030)
Urobilinogen, Ur: 0.2 mg/dL (ref 0.2–1.0)
pH, UA: 6.5 (ref 5.0–7.5)

## 2021-12-05 MED ORDER — NITROFURANTOIN MACROCRYSTAL 100 MG PO CAPS
100.0000 mg | ORAL_CAPSULE | Freq: Every day | ORAL | 1 refills | Status: DC
Start: 2021-12-05 — End: 2022-01-20
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
## 2021-12-06 ENCOUNTER — Ambulatory Visit: Payer: Medicaid Other

## 2021-12-07 LAB — URINE CULTURE

## 2021-12-07 NOTE — Progress Notes (Signed)
Urinalysis reviewed during clinic visit - no treatment indicated.   Jazzmin Newbold, RN  

## 2021-12-12 ENCOUNTER — Ambulatory Visit: Payer: Medicaid Other | Attending: Obstetrics and Gynecology

## 2021-12-12 ENCOUNTER — Other Ambulatory Visit: Payer: Self-pay

## 2021-12-12 ENCOUNTER — Ambulatory Visit: Payer: Medicaid Other | Admitting: *Deleted

## 2021-12-12 VITALS — BP 129/73 | HR 107

## 2021-12-12 DIAGNOSIS — Z362 Encounter for other antenatal screening follow-up: Secondary | ICD-10-CM

## 2021-12-12 DIAGNOSIS — O365931 Maternal care for other known or suspected poor fetal growth, third trimester, fetus 1: Secondary | ICD-10-CM | POA: Diagnosis present

## 2021-12-12 DIAGNOSIS — O283 Abnormal ultrasonic finding on antenatal screening of mother: Secondary | ICD-10-CM | POA: Diagnosis present

## 2021-12-12 DIAGNOSIS — O365930 Maternal care for other known or suspected poor fetal growth, third trimester, not applicable or unspecified: Secondary | ICD-10-CM

## 2021-12-12 DIAGNOSIS — Z3689 Encounter for other specified antenatal screening: Secondary | ICD-10-CM | POA: Diagnosis present

## 2021-12-12 DIAGNOSIS — Z3A33 33 weeks gestation of pregnancy: Secondary | ICD-10-CM

## 2021-12-12 DIAGNOSIS — Z2839 Other underimmunization status: Secondary | ICD-10-CM | POA: Insufficient documentation

## 2021-12-12 DIAGNOSIS — O09899 Supervision of other high risk pregnancies, unspecified trimester: Secondary | ICD-10-CM | POA: Insufficient documentation

## 2021-12-13 ENCOUNTER — Other Ambulatory Visit: Payer: Self-pay | Admitting: *Deleted

## 2021-12-13 ENCOUNTER — Ambulatory Visit: Payer: Medicaid Other

## 2021-12-13 DIAGNOSIS — O283 Abnormal ultrasonic finding on antenatal screening of mother: Secondary | ICD-10-CM

## 2021-12-13 DIAGNOSIS — O99320 Drug use complicating pregnancy, unspecified trimester: Secondary | ICD-10-CM

## 2021-12-13 DIAGNOSIS — O99333 Smoking (tobacco) complicating pregnancy, third trimester: Secondary | ICD-10-CM

## 2021-12-19 ENCOUNTER — Ambulatory Visit: Payer: Medicaid Other

## 2021-12-25 ENCOUNTER — Ambulatory Visit: Payer: Medicaid Other | Admitting: Nurse Practitioner

## 2021-12-25 ENCOUNTER — Other Ambulatory Visit: Payer: Self-pay

## 2021-12-25 ENCOUNTER — Encounter: Payer: Self-pay | Admitting: Nurse Practitioner

## 2021-12-25 VITALS — BP 113/63 | HR 86 | Temp 97.8°F | Wt 139.0 lb

## 2021-12-25 DIAGNOSIS — O365929 Maternal care for other known or suspected poor fetal growth, second trimester, other fetus: Secondary | ICD-10-CM

## 2021-12-25 DIAGNOSIS — Z3400 Encounter for supervision of normal first pregnancy, unspecified trimester: Secondary | ICD-10-CM

## 2021-12-25 DIAGNOSIS — Z87891 Personal history of nicotine dependence: Secondary | ICD-10-CM

## 2021-12-25 DIAGNOSIS — Z8659 Personal history of other mental and behavioral disorders: Secondary | ICD-10-CM

## 2021-12-25 DIAGNOSIS — R825 Elevated urine levels of drugs, medicaments and biological substances: Secondary | ICD-10-CM

## 2021-12-25 DIAGNOSIS — O2340 Unspecified infection of urinary tract in pregnancy, unspecified trimester: Secondary | ICD-10-CM

## 2021-12-25 DIAGNOSIS — Z59819 Housing instability, housed unspecified: Secondary | ICD-10-CM
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
## 2021-12-25 NOTE — Progress Notes (Signed)
Per client, aware of next [facility] appt in [facility] 01/10/2022 at 1230. Jossie Ng, RN

## 2021-12-27 ENCOUNTER — Encounter: Payer: Self-pay | Admitting: Family Medicine

## 2021-12-31 ENCOUNTER — Encounter: Payer: Self-pay | Admitting: Family Medicine

## 2021-12-31 NOTE — Progress Notes (Unsigned)
Patient ID: Pamela Friedman, female   DOB: [birth year], 21 y.o.   MRN: [id number] [facility] Maternity Care Conference  Maternity Care Conference Date: 12/31/21  Pamela Friedman was identified by clinical staff to benefit from an interdisciplinary team approach to help improve pregnancy care.  The [facility] Maternity Care Conference includes the maternity clinic coordinator (RN), medical providers (MD/APP staff), Care Management -OBCM and Healthy Beginnings, Centering Pregnancy coordinator, Infant Mortality reduction Dietitian.  Nursing staff are also encouraged to participate. The group meets monthly to discuss patient care and coordinate services.   The patient's care care at the agency was reviewed in EMR and high risk factors evaluated in an interdisciplinary approach.    Value added interventions discussed at this care conference today were:  [facility] Meeting 2/9 Patient has expressed that she has food limitations  Case worker Thurston Hole is working with the patient  L.Synetta Fail

## 2022-01-01 ENCOUNTER — Ambulatory Visit: Payer: Medicaid Other | Admitting: Advanced Practice Midwife

## 2022-01-01 ENCOUNTER — Other Ambulatory Visit: Payer: Self-pay

## 2022-01-01 VITALS — BP 115/68 | HR 103 | Temp 97.7°F | Wt 144.4 lb

## 2022-01-01 DIAGNOSIS — Z8659 Personal history of other mental and behavioral disorders: Secondary | ICD-10-CM

## 2022-01-01 DIAGNOSIS — Z3400 Encounter for supervision of normal first pregnancy, unspecified trimester: Secondary | ICD-10-CM

## 2022-01-01 DIAGNOSIS — O2340 Unspecified infection of urinary tract in pregnancy, unspecified trimester: Secondary | ICD-10-CM

## 2022-01-01 DIAGNOSIS — O9A319 Physical abuse complicating pregnancy, unspecified trimester: Secondary | ICD-10-CM

## 2022-01-01 DIAGNOSIS — O365929 Maternal care for other known or suspected poor fetal growth, second trimester, other fetus: Secondary | ICD-10-CM

## 2022-01-01 DIAGNOSIS — O2343 Unspecified infection of urinary tract in pregnancy, third trimester: Secondary | ICD-10-CM

## 2022-01-01 DIAGNOSIS — Z87891 Personal history of nicotine dependence: Secondary | ICD-10-CM

## 2022-01-01 DIAGNOSIS — Z3403 Encounter for supervision of normal first pregnancy, third trimester: Secondary | ICD-10-CM

## 2022-01-01 DIAGNOSIS — R825 Elevated urine levels of drugs, medicaments and biological substances: Secondary | ICD-10-CM

## 2022-01-01 LAB — URINALYSIS
Bilirubin, UA: NEGATIVE
Glucose, UA: NEGATIVE
Ketones, UA: NEGATIVE
Nitrite, UA: NEGATIVE
Specific Gravity, UA: 1.03 (ref 1.005–1.030)
Urobilinogen, Ur: 2 mg/dL — ABNORMAL HIGH (ref 0.2–1.0)
pH, UA: 6.5 (ref 5.0–7.5)

## 2022-01-01 NOTE — Addendum Note (Signed)
Addended by: Floy Sabina on: 01/01/2022 02:44 PM   Modules accepted: Orders

## 2022-01-01 NOTE — Progress Notes (Signed)
Urine dip reviewed during clinic visit - urine culture order added today.   GBS and GC/Chlamydia patient self-collected.   36 week packet given to patient.   PHQ9 = 3.   Floy Sabina, RN
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
## 2022-01-02 LAB — 789231 7+OXYCODONE-BUND
Amphetamines, Urine: NEGATIVE ng/mL
BENZODIAZ UR QL: NEGATIVE ng/mL
Barbiturate screen, urine: NEGATIVE ng/mL
Cannabinoid Quant, Ur: NEGATIVE ng/mL
Cocaine (Metab.): NEGATIVE ng/mL
OPIATE SCREEN URINE: NEGATIVE ng/mL
Oxycodone/Oxymorphone, Urine: NEGATIVE ng/mL
PCP Quant, Ur: NEGATIVE ng/mL

## 2022-01-03 LAB — URINE CULTURE

## 2022-01-03 LAB — CHLAMYDIA/GC NAA, CONFIRMATION
Chlamydia trachomatis, NAA: NEGATIVE
Neisseria gonorrhoeae, NAA: NEGATIVE

## 2022-01-05 LAB — CULTURE, BETA STREP (GROUP B ONLY): Strep Gp B Culture: NEGATIVE

## 2022-01-08 ENCOUNTER — Ambulatory Visit: Payer: Medicaid Other

## 2022-01-10 ENCOUNTER — Ambulatory Visit: Payer: Medicaid Other

## 2022-01-14 ENCOUNTER — Other Ambulatory Visit: Payer: Self-pay

## 2022-01-14 ENCOUNTER — Ambulatory Visit: Payer: Medicaid Other | Admitting: *Deleted

## 2022-01-14 ENCOUNTER — Ambulatory Visit: Payer: Medicaid Other | Attending: Obstetrics

## 2022-01-14 ENCOUNTER — Encounter: Payer: Self-pay | Admitting: *Deleted

## 2022-01-14 VITALS — BP 127/77 | HR 87

## 2022-01-14 DIAGNOSIS — O09899 Supervision of other high risk pregnancies, unspecified trimester: Secondary | ICD-10-CM | POA: Diagnosis present

## 2022-01-14 DIAGNOSIS — O283 Abnormal ultrasonic finding on antenatal screening of mother: Secondary | ICD-10-CM | POA: Insufficient documentation

## 2022-01-14 DIAGNOSIS — O99320 Drug use complicating pregnancy, unspecified trimester: Secondary | ICD-10-CM | POA: Insufficient documentation

## 2022-01-14 DIAGNOSIS — O99333 Smoking (tobacco) complicating pregnancy, third trimester: Secondary | ICD-10-CM | POA: Insufficient documentation

## 2022-01-14 DIAGNOSIS — Z2839 Other underimmunization status: Secondary | ICD-10-CM | POA: Diagnosis present

## 2022-01-14 DIAGNOSIS — O99323 Drug use complicating pregnancy, third trimester: Secondary | ICD-10-CM | POA: Diagnosis not present

## 2022-01-14 DIAGNOSIS — Z3A38 38 weeks gestation of pregnancy: Secondary | ICD-10-CM

## 2022-01-15 ENCOUNTER — Ambulatory Visit: Payer: Medicaid Other

## 2022-01-18 ENCOUNTER — Encounter
Admission: EM | Disposition: A | Discharge status: Home / Self Care | Payer: Self-pay | Source: Home / Self Care | Attending: Obstetrics and Gynecology

## 2022-01-18 ENCOUNTER — Inpatient Hospital Stay
Admission: EM | Admit: 2022-01-18 | Discharge: 2022-01-20 | DRG: 787 | Disposition: A | Discharge status: Home / Self Care | Payer: Medicaid Other | Attending: Obstetrics and Gynecology | Admitting: Obstetrics and Gynecology

## 2022-01-18 ENCOUNTER — Other Ambulatory Visit: Payer: Self-pay

## 2022-01-18 ENCOUNTER — Encounter: Payer: Self-pay | Admitting: Obstetrics and Gynecology

## 2022-01-18 ENCOUNTER — Inpatient Hospital Stay: Payer: Medicaid Other | Admitting: Anesthesiology

## 2022-01-18 DIAGNOSIS — Z20822 Contact with and (suspected) exposure to covid-19: Secondary | ICD-10-CM | POA: Diagnosis present

## 2022-01-18 DIAGNOSIS — O09899 Supervision of other high risk pregnancies, unspecified trimester: Secondary | ICD-10-CM

## 2022-01-18 DIAGNOSIS — O99324 Drug use complicating childbirth: Secondary | ICD-10-CM | POA: Diagnosis present

## 2022-01-18 DIAGNOSIS — Z59819 Housing instability, housed unspecified: Secondary | ICD-10-CM | POA: Diagnosis present

## 2022-01-18 DIAGNOSIS — O9A319 Physical abuse complicating pregnancy, unspecified trimester: Secondary | ICD-10-CM | POA: Diagnosis present

## 2022-01-18 DIAGNOSIS — Z599 Problem related to housing and economic circumstances, unspecified: Secondary | ICD-10-CM

## 2022-01-18 DIAGNOSIS — O4202 Full-term premature rupture of membranes, onset of labor within 24 hours of rupture: Secondary | ICD-10-CM

## 2022-01-18 DIAGNOSIS — Z2839 Other underimmunization status: Secondary | ICD-10-CM

## 2022-01-18 DIAGNOSIS — O321XX0 Maternal care for breech presentation, not applicable or unspecified: Secondary | ICD-10-CM

## 2022-01-18 DIAGNOSIS — Z3A39 39 weeks gestation of pregnancy: Secondary | ICD-10-CM

## 2022-01-18 DIAGNOSIS — T7411XD Adult physical abuse, confirmed, subsequent encounter: Secondary | ICD-10-CM

## 2022-01-18 DIAGNOSIS — F1290 Cannabis use, unspecified, uncomplicated: Secondary | ICD-10-CM | POA: Diagnosis present

## 2022-01-18 DIAGNOSIS — F32A Depression, unspecified: Secondary | ICD-10-CM

## 2022-01-18 DIAGNOSIS — O99344 Other mental disorders complicating childbirth: Secondary | ICD-10-CM

## 2022-01-18 DIAGNOSIS — F1910 Other psychoactive substance abuse, uncomplicated: Secondary | ICD-10-CM

## 2022-01-18 DIAGNOSIS — R825 Elevated urine levels of drugs, medicaments and biological substances: Secondary | ICD-10-CM | POA: Diagnosis present

## 2022-01-18 DIAGNOSIS — O4292 Full-term premature rupture of membranes, unspecified as to length of time between rupture and onset of labor: Principal | ICD-10-CM | POA: Diagnosis present

## 2022-01-18 DIAGNOSIS — O328XX0 Maternal care for other malpresentation of fetus, not applicable or unspecified: Secondary | ICD-10-CM | POA: Diagnosis present

## 2022-01-18 DIAGNOSIS — Z3403 Encounter for supervision of normal first pregnancy, third trimester: Secondary | ICD-10-CM | POA: Diagnosis not present

## 2022-01-18 DIAGNOSIS — Z8659 Personal history of other mental and behavioral disorders: Secondary | ICD-10-CM

## 2022-01-18 DIAGNOSIS — O9A312 Physical abuse complicating pregnancy, second trimester: Secondary | ICD-10-CM

## 2022-01-18 LAB — URINE DRUG SCREEN, QUALITATIVE (ARMC ONLY)
Amphetamines, Ur Screen: NOT DETECTED
Barbiturates, Ur Screen: NOT DETECTED
Benzodiazepine, Ur Scrn: NOT DETECTED
Cannabinoid 50 Ng, Ur ~~LOC~~: NOT DETECTED
Cocaine Metabolite,Ur ~~LOC~~: NOT DETECTED
MDMA (Ecstasy)Ur Screen: NOT DETECTED
Methadone Scn, Ur: NOT DETECTED
Opiate, Ur Screen: NOT DETECTED
Phencyclidine (PCP) Ur S: NOT DETECTED
Tricyclic, Ur Screen: NOT DETECTED

## 2022-01-18 LAB — CBC
HCT: 34.2 % — ABNORMAL LOW (ref 36.0–46.0)
Hemoglobin: 11.4 g/dL — ABNORMAL LOW (ref 12.0–15.0)
MCH: 30.4 pg (ref 26.0–34.0)
MCHC: 33.3 g/dL (ref 30.0–36.0)
MCV: 91.2 fL (ref 80.0–100.0)
Platelets: 341 10*3/uL (ref 150–400)
RBC: 3.75 MIL/uL — ABNORMAL LOW (ref 3.87–5.11)
RDW: 13.5 % (ref 11.5–15.5)
WBC: 10 10*3/uL (ref 4.0–10.5)
nRBC: 0 % (ref 0.0–0.2)

## 2022-01-18 LAB — TYPE AND SCREEN
ABO/RH(D): O POS
Antibody Screen: NEGATIVE

## 2022-01-18 LAB — RESP PANEL BY RT-PCR (FLU A&B, COVID) ARPGX2
Influenza A by PCR: NEGATIVE
Influenza B by PCR: NEGATIVE
SARS Coronavirus 2 by RT PCR: NEGATIVE

## 2022-01-18 LAB — ABO/RH: ABO/RH(D): O POS

## 2022-01-18 SURGERY — Surgical Case
Anesthesia: Spinal

## 2022-01-18 MED ORDER — LIDOCAINE 5 % EX PTCH
1.0000 | MEDICATED_PATCH | CUTANEOUS | Status: DC
  Administered 2022-01-19 – 2022-01-20 (×2): 1 via TRANSDERMAL
  Filled 2022-01-18 (×4): qty 1

## 2022-01-18 MED ORDER — LACTATED RINGERS IV SOLN: 500.0000 mL | INTRAVENOUS | Status: DC | PRN

## 2022-01-18 MED ORDER — OXYTOCIN BOLUS FROM INFUSION: 333.0000 mL | Freq: Once | INTRAVENOUS | Status: DC

## 2022-01-18 MED ORDER — BUPIVACAINE IN DEXTROSE 0.75-8.25 % IT SOLN
INTRATHECAL | Status: DC | PRN
Start: 2022-01-18 — End: 2022-01-18
  Administered 2022-01-18: 1.5 mL via INTRATHECAL

## 2022-01-18 MED ORDER — OXYCODONE-ACETAMINOPHEN 5-325 MG PO TABS: 1.0000 | ORAL_TABLET | ORAL | Status: DC | PRN

## 2022-01-18 MED ORDER — MEPERIDINE HCL 25 MG/ML IJ SOLN: 6.2500 mg | INTRAMUSCULAR | Status: DC | PRN

## 2022-01-18 MED ORDER — IBUPROFEN 600 MG PO TABS
600.0000 mg | ORAL_TABLET | Freq: Four times a day (QID) | ORAL | Status: DC
  Administered 2022-01-19 – 2022-01-20 (×6): 600 mg via ORAL
  Filled 2022-01-18 (×8): qty 1

## 2022-01-18 MED ORDER — PHENYLEPHRINE HCL-NACL 20-0.9 MG/250ML-% IV SOLN
INTRAVENOUS | Status: DC | PRN
  Administered 2022-01-18: 50 ug/min via INTRAVENOUS

## 2022-01-18 MED ORDER — NALOXONE HCL 4 MG/10ML IJ SOLN
1.0000 ug/kg/h | INTRAVENOUS | Status: DC | PRN
  Filled 2022-01-18: qty 5

## 2022-01-18 MED ORDER — LACTATED RINGERS IV SOLN: INTRAVENOUS | Status: DC

## 2022-01-18 MED ORDER — KETOROLAC TROMETHAMINE 30 MG/ML IJ SOLN
INTRAMUSCULAR | Status: AC
  Filled 2022-01-18: qty 1

## 2022-01-18 MED ORDER — SENNOSIDES-DOCUSATE SODIUM 8.6-50 MG PO TABS
2.0000 | ORAL_TABLET | ORAL | Status: DC
  Administered 2022-01-18 – 2022-01-19 (×2): 2 via ORAL
  Filled 2022-01-18 (×3): qty 2

## 2022-01-18 MED ORDER — PRENATAL MULTIVITAMIN CH
1.0000 | ORAL_TABLET | Freq: Every day | ORAL | Status: DC
  Administered 2022-01-19 – 2022-01-20 (×2): 1 via ORAL
  Filled 2022-01-18 (×2): qty 1

## 2022-01-18 MED ORDER — MORPHINE SULFATE (PF) 0.5 MG/ML IJ SOLN
INTRAMUSCULAR | Status: DC | PRN
  Administered 2022-01-18: .1 mg via INTRATHECAL

## 2022-01-18 MED ORDER — SIMETHICONE 80 MG PO CHEW
80.0000 mg | CHEWABLE_TABLET | Freq: Four times a day (QID) | ORAL | Status: DC
  Administered 2022-01-18 – 2022-01-20 (×8): 80 mg via ORAL
  Filled 2022-01-18 (×7): qty 1

## 2022-01-18 MED ORDER — DIPHENHYDRAMINE HCL 25 MG PO CAPS
25.0000 mg | ORAL_CAPSULE | Freq: Four times a day (QID) | ORAL | Status: DC | PRN
  Filled 2022-01-18 (×2): qty 1

## 2022-01-18 MED ORDER — DIPHENHYDRAMINE HCL 50 MG/ML IJ SOLN
12.5000 mg | INTRAMUSCULAR | Status: DC | PRN
  Administered 2022-01-18: 12.5 mg via INTRAVENOUS
  Filled 2022-01-18: qty 1

## 2022-01-18 MED ORDER — ACETAMINOPHEN 325 MG PO TABS: 650.0000 mg | ORAL_TABLET | ORAL | Status: DC | PRN

## 2022-01-18 MED ORDER — KETOROLAC TROMETHAMINE 30 MG/ML IJ SOLN
INTRAMUSCULAR | Status: DC | PRN
  Administered 2022-01-18: 30 mg via INTRAVENOUS

## 2022-01-18 MED ORDER — ONDANSETRON HCL 4 MG/2ML IJ SOLN
INTRAMUSCULAR | Status: AC
  Filled 2022-01-18: qty 2

## 2022-01-18 MED ORDER — OXYTOCIN-SODIUM CHLORIDE 30-0.9 UT/500ML-% IV SOLN
2.5000 [IU]/h | INTRAVENOUS | Status: AC
  Administered 2022-01-18: 2.5 [IU]/h via INTRAVENOUS

## 2022-01-18 MED ORDER — OXYCODONE-ACETAMINOPHEN 5-325 MG PO TABS: 2.0000 | ORAL_TABLET | ORAL | Status: DC | PRN

## 2022-01-18 MED ORDER — NALOXONE HCL 0.4 MG/ML IJ SOLN: 0.4000 mg | INTRAMUSCULAR | Status: DC | PRN

## 2022-01-18 MED ORDER — DIPHENHYDRAMINE HCL 25 MG PO CAPS
25.0000 mg | ORAL_CAPSULE | ORAL | Status: DC | PRN
  Administered 2022-01-18 (×2): 25 mg via ORAL

## 2022-01-18 MED ORDER — PHENYLEPHRINE HCL-NACL 20-0.9 MG/250ML-% IV SOLN
INTRAVENOUS | Status: AC
  Filled 2022-01-18: qty 250

## 2022-01-18 MED ORDER — ONDANSETRON HCL 4 MG/2ML IJ SOLN: 4.0000 mg | Freq: Three times a day (TID) | INTRAMUSCULAR | Status: DC | PRN

## 2022-01-18 MED ORDER — SODIUM CHLORIDE 0.9% FLUSH: 3.0000 mL | INTRAVENOUS | Status: DC | PRN

## 2022-01-18 MED ORDER — KETOROLAC TROMETHAMINE 30 MG/ML IJ SOLN: 30.0000 mg | Freq: Four times a day (QID) | INTRAMUSCULAR | Status: AC | PRN

## 2022-01-18 MED ORDER — LIDOCAINE 5 % EX PTCH
MEDICATED_PATCH | CUTANEOUS | Status: DC | PRN
  Administered 2022-01-18: 1 via TRANSDERMAL

## 2022-01-18 MED ORDER — ZOLPIDEM TARTRATE 5 MG PO TABS: 5.0000 mg | ORAL_TABLET | Freq: Every evening | ORAL | Status: DC | PRN

## 2022-01-18 MED ORDER — FENTANYL CITRATE (PF) 100 MCG/2ML IJ SOLN
INTRAMUSCULAR | Status: AC
  Filled 2022-01-18: qty 2

## 2022-01-18 MED ORDER — OXYTOCIN-SODIUM CHLORIDE 30-0.9 UT/500ML-% IV SOLN
INTRAVENOUS | Status: DC | PRN
Start: 2022-01-18 — End: 2022-01-18
  Administered 2022-01-18: 30 [IU] via INTRAVENOUS

## 2022-01-18 MED ORDER — OXYTOCIN-SODIUM CHLORIDE 30-0.9 UT/500ML-% IV SOLN
2.5000 [IU]/h | INTRAVENOUS | Status: DC
  Filled 2022-01-18: qty 1000

## 2022-01-18 MED ORDER — SOD CITRATE-CITRIC ACID 500-334 MG/5ML PO SOLN
ORAL | Status: AC
  Filled 2022-01-18: qty 15

## 2022-01-18 MED ORDER — FENTANYL CITRATE (PF) 100 MCG/2ML IJ SOLN
INTRAMUSCULAR | Status: DC | PRN
Start: 2022-01-18 — End: 2022-01-18
  Administered 2022-01-18: 15 ug via INTRATHECAL

## 2022-01-18 MED ORDER — ONDANSETRON HCL 4 MG/2ML IJ SOLN: 4.0000 mg | Freq: Four times a day (QID) | INTRAMUSCULAR | Status: DC | PRN

## 2022-01-18 MED ORDER — ONDANSETRON HCL 4 MG/2ML IJ SOLN
INTRAMUSCULAR | Status: DC | PRN
Start: 2022-01-18 — End: 2022-01-18
  Administered 2022-01-18: 4 mg via INTRAVENOUS

## 2022-01-18 MED ORDER — MENTHOL 3 MG MT LOZG
1.0000 | LOZENGE | OROMUCOSAL | Status: DC | PRN
  Filled 2022-01-18: qty 9

## 2022-01-18 MED ORDER — KETOROLAC TROMETHAMINE 30 MG/ML IJ SOLN
30.0000 mg | Freq: Four times a day (QID) | INTRAMUSCULAR | Status: AC | PRN
  Administered 2022-01-18 (×2): 30 mg via INTRAVENOUS
  Filled 2022-01-18 (×2): qty 1

## 2022-01-18 MED ORDER — LIDOCAINE HCL (PF) 1 % IJ SOLN: 30.0000 mL | INTRAMUSCULAR | Status: DC | PRN

## 2022-01-18 MED ORDER — SOD CITRATE-CITRIC ACID 500-334 MG/5ML PO SOLN
30.0000 mL | ORAL | Status: DC | PRN
  Administered 2022-01-18: 30 mL via ORAL

## 2022-01-18 MED ORDER — MORPHINE SULFATE (PF) 0.5 MG/ML IJ SOLN
INTRAMUSCULAR | Status: AC
Start: 2022-01-18 — End: ?
  Filled 2022-01-18: qty 10

## 2022-01-18 MED ORDER — CEFAZOLIN SODIUM-DEXTROSE 2-4 GM/100ML-% IV SOLN
2.0000 g | Freq: Once | INTRAVENOUS | Status: AC
  Administered 2022-01-18: 2 g via INTRAVENOUS
  Filled 2022-01-18: qty 100

## 2022-01-18 MED ORDER — ACETAMINOPHEN 500 MG PO TABS
1000.0000 mg | ORAL_TABLET | Freq: Four times a day (QID) | ORAL | Status: AC
  Administered 2022-01-18 – 2022-01-19 (×4): 1000 mg via ORAL
  Filled 2022-01-18 (×4): qty 2

## 2022-01-18 SURGICAL SUPPLY — 29 items
ADHESIVE MASTISOL STRL (MISCELLANEOUS) ×2 IMPLANT
BACTOSHIELD CHG 4% 4OZ (MISCELLANEOUS) ×1
BAG COUNTER SPONGE SURGICOUNT (BAG) ×2 IMPLANT
CHLORAPREP W/TINT 26 (MISCELLANEOUS) ×4 IMPLANT
CLOSURE STERI STRIP 1/2 X4 (GAUZE/BANDAGES/DRESSINGS) ×1 IMPLANT
DRESSING TELFA 8X3 (GAUZE/BANDAGES/DRESSINGS) ×1 IMPLANT
DRSG TELFA 3X8 NADH (GAUZE/BANDAGES/DRESSINGS) ×2 IMPLANT
GAUZE SPONGE 4X4 12PLY STRL (GAUZE/BANDAGES/DRESSINGS) ×2 IMPLANT
GLOVE SURG POLY ORTHO LF SZ7.5 (GLOVE) ×2 IMPLANT
GOWN STRL REUS W/ TWL LRG LVL3 (GOWN DISPOSABLE) ×2 IMPLANT
GOWN STRL REUS W/TWL LRG LVL3 (GOWN DISPOSABLE) ×2
KIT TURNOVER KIT A (KITS) ×2 IMPLANT
MANIFOLD NEPTUNE II (INSTRUMENTS) ×2 IMPLANT
MAT PREVALON FULL STRYKER (MISCELLANEOUS) ×2 IMPLANT
NS IRRIG 1000ML POUR BTL (IV SOLUTION) ×2 IMPLANT
PACK C SECTION AR (MISCELLANEOUS) ×2 IMPLANT
PAD DRESSING TELFA 3X8 NADH (GAUZE/BANDAGES/DRESSINGS) ×1 IMPLANT
PAD OB MATERNITY 4.3X12.25 (PERSONAL CARE ITEMS) ×2 IMPLANT
PAD PREP 24X41 OB/GYN DISP (PERSONAL CARE ITEMS) ×2 IMPLANT
PENCIL SMOKE EVACUATOR (MISCELLANEOUS) ×2 IMPLANT
RETRACTOR WND ALEXIS-O 25 LRG (MISCELLANEOUS) ×1 IMPLANT
RTRCTR WOUND ALEXIS O 25CM LRG (MISCELLANEOUS) ×2
SCRUB CHG 4% DYNA-HEX 4OZ (MISCELLANEOUS) ×1 IMPLANT
SPONGE T-LAP 18X18 ~~LOC~~+RFID (SPONGE) ×2 IMPLANT
SUT VIC AB 0 CTX 36 (SUTURE) ×2
SUT VIC AB 0 CTX36XBRD ANBCTRL (SUTURE) ×2 IMPLANT
SUT VIC AB 1 CT1 36 (SUTURE) ×4 IMPLANT
SUT VICRYL+ 3-0 36IN CT-1 (SUTURE) ×4 IMPLANT
WATER STERILE IRR 500ML POUR (IV SOLUTION) ×2 IMPLANT

## 2022-01-18 NOTE — Anesthesia Preprocedure Evaluation (Addendum)
Anesthesia Evaluation  ?Patient identified by MRN, date of birth, ID band ?Patient awake ? ? ? ?Reviewed: ?Allergy & Precautions, NPO status , Patient's Chart, lab work & pertinent test results ? ?Airway ?Mallampati: II ? ?TM Distance: >3 FB ?Neck ROM: Full ? ? ? Dental ? ?(+) Teeth Intact ?  ?Pulmonary ?neg pulmonary ROS,  ?  ?Pulmonary exam normal ?breath sounds clear to auscultation ? ? ? ? ? ? Cardiovascular ?Exercise Tolerance: Good ?negative cardio ROS ?Normal cardiovascular exam ?Rhythm:Regular  ? ?  ?Neuro/Psych ?negative neurological ROS ?   ? GI/Hepatic ?negative GI ROS, Neg liver ROS,   ?Endo/Other  ?negative endocrine ROS ? Renal/GU ?negative Renal ROS  ?negative genitourinary ?  ?Musculoskeletal ? ? Abdominal ?Normal abdominal exam  (+)   ?Peds ?negative pediatric ROS ?(+)  Hematology ?negative hematology ROS ?(+) Blood dyscrasia, anemia ,   ?Anesthesia Other Findings ?Past Medical History: ?No date: Anemia ?No date: Mental disorder ?    Comment:  hx depression, suicidal thoughts ? ?Past Surgical History: ?No date: denies ?No date: NO PAST SURGERIES ? ?BMI   ? Body Mass Index: 25.69 kg/m?  ?  ? ? Reproductive/Obstetrics ?(+) Pregnancy ? ?  ? ? ? ? ? ? ? ? ? ? ? ? ? ?  ?  ? ? ? ? ? ? ? ? ?Anesthesia Physical ?Anesthesia Plan ? ?ASA: 2 ? ?Anesthesia Plan: Spinal  ? ?Post-op Pain Management:   ? ?Induction:  ? ?PONV Risk Score and Plan:  ? ?Airway Management Planned: Natural Airway ? ?Additional Equipment:  ? ?Intra-op Plan:  ? ?Post-operative Plan:  ? ?Informed Consent: I have reviewed the patients History and Physical, chart, labs and discussed the procedure including the risks, benefits and alternatives for the proposed anesthesia with the patient or authorized representative who has indicated his/her understanding and acceptance.  ? ? ? ?Dental Advisory Given ? ?Plan Discussed with: CRNA and Surgeon ? ?Anesthesia Plan Comments:   ? ? ? ? ? ? ?Anesthesia Quick  Evaluation ? ?

## 2022-01-18 NOTE — Transfer of Care (Signed)
Immediate Anesthesia Transfer of Care Note ? ?Patient: Pamela Friedman. [name] ? ?Procedure(s) Performed: CESAREAN SECTION ? ?Patient Location: PACU ? ?Anesthesia Type:spinal ? ?Level of Consciousness: awake, alert  and oriented ? ?Airway & Oxygen Therapy: Patient Spontanous Breathing ? ?Post-op Assessment: Report given to RN and Post -op Vital signs reviewed and stable ? ?Post vital signs: Reviewed and stable ? ?Last Vitals:  ?Vitals Value Taken Time  ?BP 117/70 01/18/22 1237  ?Temp 36.7 ?C 01/18/22 1237  ?Pulse 81 01/18/22 1237  ?Resp 11 01/18/22 1237  ?SpO2 99 % 01/18/22 1237  ? ? ?Last Pain:  ?Vitals:  ? 01/18/22 1237  ?TempSrc:   ?PainSc: 0-No pain  ?   ? ?  ? ?Complications: No notable events documented. ?

## 2022-01-18 NOTE — Op Note (Addendum)
? ? ?    OP NOTE ? ?Date: 01/18/2022   12:23 PM ?Name Pamela Friedman ?MR# [id number] ? ?Preoperative Diagnosis: ?1. Intrauterine pregnancy at [redacted]w[redacted]d ?Principal Problem: ?  Indication for care in labor or delivery ? ?2.  Breech presentation ? ?Postoperative Diagnosis: ?1. Intrauterine pregnancy at [redacted]w[redacted]d, delivered ?2. Viable female infant ?3. Remainder same as pre-op ? ? ?Procedure: ?1. Primary Low-Transverse Cesarean Section ? ?Surgeon: Finis Bud, MD ? ?Assistant:  Marcelline Mates ?  No other capable assistant was available for this surgery which requires an experienced, high level assistant.   ? ?Anesthesia: Spinal  ? ? ?EBL: 0  ml   ? ? ?Findings: ?1) female infant, Apgar scores of 8   at 1 minute and 9   at 5 minutes and a birthweight of 103  ounces.   ? ?2) Normal uterus, tubes and ovaries.  ? ? ?Procedure:  ?The patient was prepped and draped in the supine position and placed under spinal anesthesia.  A transverse incision was made across the abdomen in a Pfannenstiel manner. If indicated the old scar was systematically removed with sharp dissection.  We carried the dissection down to the level of the fascia.  The fascia was incised in a curvilinear manner.  The fascia was then elevated from the rectus muscles with blunt and sharp dissection.  The rectus muscles were separated laterally exposing the peritoneum.  The peritoneum was carefully entered with care being taken to avoid bowel and bladder.  A self-retaining retractor was placed.  The visceral peritoneum was incised in a curvilinear fashion across the lower uterine segment creating a bladder flap. A transverse incision was made across the lower uterine segment and extended laterally and superiorly using the bandage scissors.  Artificial rupture membranes was performed and Clear fluid was noted.  The infant was delivered from the footling breech  position.  A nuchal cord was not present. After an appropriate time interval, the cord was doubly clamped and cut.  Cord blood was obtained if required.  The infant was handed to the pediatric personnel  who then placed the infant under heat lamps where it was cleaned dried and suctioned as needed. The placenta was delivered. ?The hysterotomy incision was then identified on ring forceps.  The uterine cavity was cleaned with a moist lap sponge.  The hysterotomy incision was closed with a running interlocking suture of Vicryl.  Hemostasis was excellent.  Pitocin was run in the IV and the uterus was found to be firm. ?The posterior cul-de-sac and gutters were cleaned and inspected.  Hemostasis was noted.  The fascia was then closed with a running suture of #1 Vicryl.  Hemostasis of the subcutaneous tissues was obtained using the Bovie.  The subcutaneous tissues were closed with a running suture of 000 Vicryl.  A subcuticular suture was placed.  Sterri-strips were applied in the usual manner.  A pressure dressing was placed.  The patient went to the recovery room in stable condition. ?Dr. Marcelline Mates provided exposure, dissection, suctioning, retraction, and general support and assistance during the procedure. ? ? ? ?Finis Bud, M.D. ?01/18/2022 ?12:23 PM ? ?   ? ?

## 2022-01-18 NOTE — Lactation Note (Signed)
This note was copied from a baby's chart. ?Lactation Consultation Note ? ?Patient Name: Pamela Friedman ?Today's Date: 01/18/2022 ?Reason for consult: L&D Initial assessment;Primapara;1st time breastfeeding;Term ?Age:21 hours ? ?Maternal Data ?Has patient been taught Hand Expression?: Yes ?Does the patient have breastfeeding experience prior to this delivery?: No ? ?Feeding ?Mother's Current Feeding Choice: Breast Milk and Formula ?Assisted mom with first feeding in labor room, baby rooting, in cradle hold on left breast, mom shown how to hand express colostrum, and how to assist baby with latching to breast by shaping breast, baby latched easily and began nursing well with audible swallows, nursed 10 min, came off by herself and mom shown how to burp baby, baby was able to burp and then transferred to right breast in cradle hold and again latched easily with shaping of breast, audible swallows heard and nursed x approx 20 min, baby placed on  mom's chest, skin to skin after ?LATCH Score ?Latch: Grasps breast easily, tongue down, lips flanged, rhythmical sucking. ? ?Audible Swallowing: Spontaneous and intermittent ? ?Type of Nipple: Everted at rest and after stimulation ? ?Comfort (Breast/Nipple): Soft / non-tender ? ?Hold (Positioning): Assistance needed to correctly position infant at breast and maintain latch. ? ?LATCH Score: 9 ? ? ?Lactation Tools Discussed/Used ?  ? ?Interventions ?Interventions: Breast feeding basics reviewed;Assisted with latch;Skin to skin;Hand express;Adjust position;Breast compression;Support pillows;Education ? ?Discharge ?[facility] Program: Yes ? ?Consult Status ?Consult Status: Follow-up from L&D ? ? ? ?Ferol Luz ?01/18/2022, 2:34 PM ? ? ? ?

## 2022-01-18 NOTE — Anesthesia Procedure Notes (Signed)
Spinal ? ?Patient location during procedure: OR ?Start time: 01/18/2022 11:30 AM ?End time: 01/18/2022 11:31 AM ?Reason for block: surgical anesthesia ?Staffing ?Performed: resident/CRNA  ?Anesthesiologist: [place] [facility], Jane Canary, MD ?Resident/CRNA: Hedda Slade, CRNA ?Preanesthetic Checklist ?Completed: patient identified, IV checked, site marked, risks and benefits discussed, surgical consent, monitors and equipment checked, pre-op evaluation and timeout performed ?Spinal Block ?Patient position: sitting ?Prep: ChloraPrep ?Patient monitoring: heart rate, continuous pulse ox, blood pressure and cardiac monitor ?Approach: midline ?Location: L3-4 ?Injection technique: single-shot ?Needle ?Needle type: Whitacre and Introducer  ?Needle gauge: 24 G ?Needle length: 9 cm ?Assessment ?Events: CSF return ?Additional Notes ?Sterile aseptic technique used throughout the procedure.  Negative paresthesia. Negative blood return. Positive free-flowing CSF. Expiration date of kit checked and confirmed. Patient tolerated procedure well, without complications. ? ? ? ? ? ?
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
## 2022-01-19 DIAGNOSIS — O4292 Full-term premature rupture of membranes, unspecified as to length of time between rupture and onset of labor: Secondary | ICD-10-CM | POA: Diagnosis present

## 2022-01-19 LAB — CBC
HCT: 35.1 % — ABNORMAL LOW (ref 36.0–46.0)
Hemoglobin: 11.6 g/dL — ABNORMAL LOW (ref 12.0–15.0)
MCH: 29.8 pg (ref 26.0–34.0)
MCHC: 33 g/dL (ref 30.0–36.0)
MCV: 90.2 fL (ref 80.0–100.0)
Platelets: 313 10*3/uL (ref 150–400)
RBC: 3.89 MIL/uL (ref 3.87–5.11)
RDW: 13.4 % (ref 11.5–15.5)
WBC: 11.1 10*3/uL — ABNORMAL HIGH (ref 4.0–10.5)
nRBC: 0 % (ref 0.0–0.2)

## 2022-01-19 LAB — RPR: RPR Ser Ql: NONREACTIVE

## 2022-01-19 MED ORDER — VARICELLA VIRUS VACCINE LIVE 1350 PFU/0.5ML IJ SUSR
0.5000 mL | INTRAMUSCULAR | Status: DC | PRN
Start: 2022-01-19 — End: 2022-01-20
  Filled 2022-01-19: qty 0.5

## 2022-01-19 NOTE — Anesthesia Post-op Follow-up Note (Signed)
?  Anesthesia Pain Follow-up Note ? ?Patient: Pamela Friedman. [place] ? ?Day #: 1 ? ?Date of Follow-up: 01/19/2022 Time: 8:59 AM ? ?Last Vitals:  ?Vitals:  ? 01/18/22 2356 01/19/22 0757  ?BP: 119/65 112/71  ?Pulse: 77 80  ?Resp: 18 16  ?Temp: 37.2 ?C 36.9 ?C  ?SpO2: 98% 98%  ? ? ?Level of Consciousness: alert ? ?Pain: none  ? ?Side Effects:None ? ?Catheter Site Exam:clean, dry, no drainage ? ? ? ? ?Plan: D/C from anesthesia care at surgeon's request ? ?[name] ? ? ? ?

## 2022-01-19 NOTE — Anesthesia Postprocedure Evaluation (Signed)
Anesthesia Post Note ? ?Patient: Pamela Friedman. [place] ? ?Procedure(s) Performed: CESAREAN SECTION ? ?Patient location during evaluation: Mother Baby ?Anesthesia Type: Spinal ?Level of consciousness: oriented and awake and alert ?Pain management: pain level controlled ?Vital Signs Assessment: post-procedure vital signs reviewed and stable ?Respiratory status: spontaneous breathing and respiratory function stable ?Cardiovascular status: blood pressure returned to baseline and stable ?Postop Assessment: no headache, no backache, no apparent nausea or vomiting and able to ambulate ?Anesthetic complications: no ? ? ?No notable events documented. ? ? ?Last Vitals:  ?Vitals:  ? 01/18/22 2356 01/19/22 0757  ?BP: 119/65 112/71  ?Pulse: 77 80  ?Resp: 18 16  ?Temp: 37.2 ?C 36.9 ?C  ?SpO2: 98% 98%  ?  ?Last Pain:  ?Vitals:  ? 01/19/22 0800  ?TempSrc:   ?PainSc: 0-No pain  ? ? ?  ?  ?  ?  ?  ?  ? ?[name] ? ? ? ? ?

## 2022-01-19 NOTE — Lactation Note (Signed)
This note was copied from a baby's chart. ?Lactation Consultation Note ? ?Patient Name: Pamela Friedman ?Today's Date: 01/19/2022 ?Reason for consult: Follow-up assessment;Other (Comment) ?Age:21 HOURS  ?  ?Discharge ?Pump: Manual (gave and reviewed manual for home) ? ?Consult Status ?Consult Status: PRN ? ?Pamela Friedman ?01/19/2022, 4:26 PM ? ? ? ?

## 2022-01-19 NOTE — Lactation Note (Signed)
This note was copied from a baby's chart. ?Lactation Consultation Note ? ?Patient Name: Pamela Friedman ?Today's Date: 01/19/2022 ?  ?Age:21 hours ?Lactation to the room for initial visit. Mother is holding the baby and stated she has not fed in 4 hours. Encouraged to unwrap baby and wake her by changing her diaper for feed. Mother was attempting hold in football and baby was struggling to get a deep latch and not maintaining latch. Encouraged cross cradle. Mother attempted with cross cradle with the same result. Encouraged to position in football on the left. Baby positioned in football and latched easily with rhythmic sucking pattern and swallows. Mother stated initially in football she did not like the position because her wrist was hurting. Offered to change position and Mother declined. Baby was left skin to skin with Mother and feeding in football on the left. Encouraged feeding on demand and with cues. If baby is not cueing encouraged hand expression and skin to skin. Taught proper technique for hand expression. Encouraged 8 or more attempts in the first 24 hours and 8 or more good feeds after 24 HOL. Reviewed appropriate diapers for days of life and How to know your baby is getting enough to eat. Reviewed "Understanding Postpartum and Newborn Care" booklet at bedside. [facility] # left on board, encouraged to call for any assistance. Mother has no further questions at this time. LC will return later this afternoon with manual pump for discharge.  ? ?  ?Pamela Friedman ?01/19/2022, 2:34 PM ? ? ? ?
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
## 2022-01-19 NOTE — Progress Notes (Signed)
Offered pt Varicella vaccine and patient declined. ? ?Pamela Friedman ?01/19/2022 ?11:59 AM ? ?
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
## 2022-01-19 NOTE — Progress Notes (Signed)
Postpartum Day # 1: Primary Cesarean Delivery (breech presentation) ? ?Subjective: ?Patient reports tolerating PO, + flatus, and no problems voiding.  Is ambulating without difficulty.  Bleeding is moderate.  Is working to breastfeed.  ? ?Objective: ?Vital signs in last 24 hours: ?Temp:  [98 ?F (36.7 ?C)-98.9 ?F (37.2 ?C)] 98.4 ?F (36.9 ?C) (03/04 0757) ?Pulse Rate:  [66-97] 80 (03/04 0757) ?Resp:  [0-43] 16 (03/04 0757) ?BP: (111-164)/(54-128) 112/71 (03/04 0757) ?SpO2:  [96 %-100 %] 98 % (03/04 0757) ? ?Physical Exam:  ?General: alert and no distress ?Lungs: clear to auscultation bilaterally ?Breasts: normal appearance, no masses or tenderness ?Heart: regular rate and rhythm, S1, S2 normal, no murmur, click, rub or gallop ?Abdomen: soft, non-tender; bowel sounds normal; no masses,  no organomegaly ?Pelvis: Lochia appropriate, Uterine Fundus firm, Incision: healing well, no significant drainage, no dehiscence, no significant erythema ?Extremities: DVT Evaluation: No evidence of DVT seen on physical exam. ?Negative Homan's sign. No cords or calf tenderness. No significant calf/ankle edema. ? ?Recent Labs  ?  01/18/22 ?[id number]  ?HGB 11.4*  ?HCT 34.2*  ? ? ?Assessment/Plan: ?Status post Cesarean section. Doing well postoperatively.  ?Post-op CBC ordered ?Breastfeeding, Lactation consult ?Contraception OCPs ?Regular diet ?Continue PO pain management ?Encourage ambulation ?Continue current care. ?Varicella vaccine postpartum, is non-immune ?Plan for discharge tomorrow ? ?Rubie Maid ?[facility] ? ? ? ? ? ?

## 2022-01-20 MED ORDER — IBUPROFEN 600 MG PO TABS: 600.0000 mg | ORAL_TABLET | Freq: Four times a day (QID) | ORAL | 1 refills | Status: DC | PRN

## 2022-01-20 MED ORDER — TRAMADOL HCL 50 MG PO TABS: 50.0000 mg | ORAL_TABLET | Freq: Four times a day (QID) | ORAL | 0 refills | Status: DC | PRN

## 2022-01-20 NOTE — Progress Notes (Signed)
Patient discharged home with infant. Discharge instructions and prescriptions given and reviewed with patient. Explained importance of follow-up care and making sure to attend appointment. Patient verbalized understanding. Escorted out by staff.  ?

## 2022-01-20 NOTE — Discharge Instructions (Signed)
Discharge Instructions:  ? ?Follow-up Appointment: Call [facility] Dept tomorrow morning to schedule a follow-up incision check in 1-week and a 6-week postpartum visit! ? ?If there are any new medications, they have been ordered and will be available for pickup at the listed pharmacy on your way home from the hospital.  ? ?Call office if you have any of the following: headache, visual changes, fever >101.0 F, chills, shortness of breath, breast concerns, excessive vaginal bleeding, incision drainage or problems, leg pain or redness, depression or any other concerns. If you have vaginal discharge with an odor, let your doctor know.  ? ?It is normal to bleed for up to 6 weeks. You should not soak through more than 1 pad in 1 hour. If you have a blood clot larger than your fist with continued bleeding, call your doctor.  ? ?After a c-section, you should expect a small amount of blood or clear fluid coming from the incision and abdominal cramping/soreness. Inspect your incision site daily. Stand in front of a mirror to look for any redness, incision opening, or discolored/odorness drainage. Take a shower daily and continue good hygiene. Use own towel and washcloth (do not share). Make sure your sheets on your bed are clean. No pets sleeping around your incision site. Dressing will be removed at your postpartum visit. If the dressing does become wet or soiled underneath, it is okay to remove it.  ? ?Activity: Do not lift > 10 lbs for 6 weeks (do not lift anything heavier than your baby). ?No intercourse, tampons, swimming pools, hot tubs, baths (only showers) for 6 weeks.  ?No driving for 1-2 weeks. ?Continue prenatal vitamin, especially if breastfeeding. ?Increase calories and fluids (water) while breastfeeding.  ? ?Your milk will come in, in the next couple of days (right now it is colostrum). You may have a slight fever when your milk comes in, but it should go away on its own.  If it does not, and rises  above 101 F please call the doctor. You will also feel achy and your breasts will be firm. They will also start to leak. If you are breastfeeding, continue as you have been and you can pump/express milk for comfort.  ? ?If you have too much milk, your breasts can become engorged, which could lead to mastitis. This is an infection of the milk ducts. It can be very painful and you will need to notify your doctor to obtain a prescription for antibiotics. You can also treat it with a shower or hot/cold compress.  ? ?For concerns about your baby, please call your pediatrician.  ?For breastfeeding concerns, the lactation consultant can be reached at [phone].  ? ?Postpartum blues (feelings of happy one minute and sad another minute) are normal for the first few weeks but if it gets worse let your doctor know.  ? ?Congratulations! We enjoyed caring for you and your new bundle of joy!   ?

## 2022-01-21 ENCOUNTER — Encounter: Payer: Self-pay | Admitting: Obstetrics and Gynecology

## 2022-01-21 ENCOUNTER — Ambulatory Visit: Payer: Medicaid Other

## 2022-01-25 ENCOUNTER — Encounter: Payer: Medicaid Other | Admitting: Obstetrics and Gynecology

## 2022-01-29 ENCOUNTER — Telehealth: Payer: Self-pay | Admitting: Licensed Clinical Social Worker

## 2022-01-29 NOTE — Telephone Encounter (Signed)
-----   Message from Kathreen Cosier, [place] sent at 10/08/2021  7:27 AM EST ----- ?Regarding: due 01-25-22 needs PP mood check ?Referral from [place], pt. Needs PP mood check. EDD 01-25-22 ? ?

## 2022-01-29 NOTE — Telephone Encounter (Signed)
TELEHEALTH VIRTUAL MOOD VISIT ENCOUNTER NOTE ? ?I connected with Pamela Friedman on 01/29/22 by telephone and verified that I am speaking with the correct person using two identifiers. ?  ?I discussed the limitations, risks, security and privacy concerns of performing an evaluation and management service by telephone and the availability of in person appointments. I also discussed with the patient that there may be a patient responsible charge related to this service. The patient expressed understanding and agreed to proceed. ? ?LCSW introduced self and established psychological safety.  ?Patient verbally consented to this telephonic session.  ? ?Patient is located at home  ?LCSW located at Offsite work location ? ?Time visit started: 1:56pm  ?Time visit ended: 2:06pm  ? ?SUBJECTIVE: ?Patient clinic or provider recommended a 2 week mood check due to risk factors for postpartum mood disorder.  Patient delivered on 01/18/22  ? ?ASSESSMENT: ?Patient currently experiencing denies any concerns. She reports having good support from her partner and that her parents had just left after coming to visit for a week. Patient reports that she is sleeping better, appetite is good and has gotten outside a few times. She also reports breastfeeding is going well.  ? ? ?PRESENTING CONCERNS: ?Patient and/or family reports the following symptoms/concerns: Denies any concerns at this time. ?Duration of problem: NA; Severity of problem:  NA ? ?STRENGTHS (Protective Factors/Coping Skills): ?Patient reports having good support from her partner and that her parents had just left after coming to visit for a week. Patient reports that she is sleeping better, appetite is good and has gotten outside a few times. She also reports breastfeeding is going well.  ? ?INTERVENTIONS: ?Interventions utilized:  Psychoeducation and/or Health Education ?Standardized Assessments completed: Not Needed ? ?Conducted brief assessment  ?Provide brief  psychoeducation on postpartum mood and anxiety disorders signs and symptoms, including information on Baby Blues, Postpartum Depression, and Postpartum Anxiety.   ?Discussed self-care strategies to prevent and/or reduce symptoms of postpartum mood and anxiety disorders, including sleep hygiene, eating regularly, drinking fluids, getting outside, and support from partner, family, and/or friends.  ? ?Informed patient to call her provider right away or get emergency help if she experiences any of the following symptoms: ?Feelings of hopelessness and total despair. ?Feeling out of touch with reality (hearing or seeing things other people don't). ?Feeling that you might hurt yourself or your baby. ? ?PLAN: ?.Patient was encouraged to reach out in the future, if needed and was provided LCSW's contact number.  ? ?Kathreen Cosier  ? ?

## 2022-01-30 ENCOUNTER — Other Ambulatory Visit: Payer: Self-pay

## 2022-01-30 ENCOUNTER — Encounter: Payer: Self-pay | Admitting: Obstetrics and Gynecology

## 2022-01-30 ENCOUNTER — Ambulatory Visit (INDEPENDENT_AMBULATORY_CARE_PROVIDER_SITE_OTHER): Payer: Medicaid Other | Admitting: Obstetrics and Gynecology

## 2022-01-30 VITALS — BP 105/73 | HR 137 | Ht 63.0 in | Wt 131.2 lb

## 2022-01-30 DIAGNOSIS — Z9889 Other specified postprocedural states: Secondary | ICD-10-CM

## 2022-01-30 MED ORDER — PRENATAL PLUS VITAMIN/MINERAL 27-1 MG PO TABS: 1.0000 | ORAL_TABLET | Freq: Every day | ORAL | 1 refills | Status: DC

## 2022-01-30 NOTE — Progress Notes (Signed)
Patient presents today for an incision check post c-section. She states no pain or bleeding but she states numbness around the site. Patient states no other questions or concerns.  ?
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
## 2022-03-04 ENCOUNTER — Telehealth: Payer: Self-pay | Admitting: Licensed Clinical Social Worker

## 2022-03-04 NOTE — Telephone Encounter (Signed)
-----   Message from Ellison Carwin sent at 03/04/2022  9:08 AM EDT ----- ?Regarding: possible pp depression ?Sorry for the delay. Here is the member that I was speaking of earlier.  She reported feeling "mentally tired".  ?Thanks,  ? ?

## 2022-03-06 ENCOUNTER — Encounter: Payer: Medicaid Other | Admitting: Obstetrics and Gynecology

## 2022-03-13 ENCOUNTER — Ambulatory Visit: Payer: Medicaid Other | Admitting: Advanced Practice Midwife

## 2022-03-13 ENCOUNTER — Encounter: Payer: Self-pay | Admitting: Family Medicine

## 2022-03-13 VITALS — BP 115/76 | Ht 63.0 in | Wt 126.0 lb

## 2022-03-13 DIAGNOSIS — Z3009 Encounter for other general counseling and advice on contraception: Secondary | ICD-10-CM | POA: Diagnosis not present

## 2022-03-13 LAB — PREGNANCY, URINE: Preg Test, Ur: NEGATIVE

## 2022-03-13 LAB — HM HIV SCREENING LAB: HM HIV Screening: NEGATIVE

## 2022-03-13 LAB — HEMOGLOBIN, FINGERSTICK: Hemoglobin: 11.6 g/dL (ref 11.1–15.9)

## 2022-03-13 MED ORDER — LEVONORGESTREL 1.5 MG PO TABS: 1.5000 mg | ORAL_TABLET | Freq: Once | ORAL | 0 refills | Status: AC

## 2022-03-13 MED ORDER — NORETHINDRONE 0.35 MG PO TABS: 1.0000 | ORAL_TABLET | Freq: Every day | ORAL | 13 refills | Status: DC

## 2022-03-13 NOTE — Progress Notes (Signed)
Patient here for PP appointment. C-section on 01/18/2022. Desires OC for birth control and needs Hgb check today. Patient has post-up 5 week incision check at [facility] on 02/12/22.Burt Knack, RN  ?

## 2022-03-13 NOTE — Progress Notes (Signed)
[facility] ? ?Postpartum Exam ? ?Pamela Friedman is a 21 y.o.SBF exvaper G1P1001 female who presents for a postpartum visit. She had SROM with breech at 39.0 wks with LTCS on 01/18/22 F 6#7. Breastfeeding q 3-4 hrs. Baby weighed 9#7 on 02/21/22. FOB helping her with the baby and employed.  Living with FOB and his mom and the baby. Not working. Denies lochia. PP coitus x 6-7 with last coitus today without condom; with current partner x 3 1/2 years; 1 partner in last 3 mo. Wants ocp's for birth control. Denies cigs, cigars. Last vaped 04/2021. Last MJ 06/2021. Last ETOH 11/2020 (1 Henessey).  Cries 2-3x/wk, poor sleep, poor appetite, low energy level, +moody, -anhedonia, -SI/HI. Declines counseling.  She is 7 weeks postpartum following a primary cesarean section.  I have fully reviewed the prenatal and intrapartum course. The delivery was at 39.0 gestational weeks.  Anesthesia: epidural. Postpartum course has been wnl. Baby is doing well. Baby is feeding by breast. Bleeding no bleeding. Bowel function is normal. Bladder function is normal. Patient is sexually active. Contraception method is none. Postpartum depression screening: positive. ? ? ?The pregnancy intention screening data noted above was reviewed. Potential methods of contraception were discussed. The patient elected to proceed with No data recorded. ? ? Edinburgh Postnatal Depression Scale - 03/13/22 1128   ? ?  ? Edinburgh Postnatal Depression Scale:  In the Past 7 Days  ? I have been able to laugh and see the funny side of things. 1   ? I have looked forward with enjoyment to things. 1   ? I have blamed myself unnecessarily when things went wrong. 2   ? I have been anxious or worried for no good reason. 2   ? I have felt scared or panicky for no good reason. 0   ? Things have been getting on top of me. 1   ? I have been so unhappy that I have had difficulty sleeping. 0   ? I have felt sad or miserable. 1   ? I have been so unhappy that  I have been crying. 1   ? The thought of harming myself has occurred to me. 0   ? Edinburgh Postnatal Depression Scale Total 9   ? ?  ?  ? ?  ? ? ?Health Maintenance Due  ?Topic Date Due  ? HPV VACCINES (1 - 2-dose series) Never done  ? HIV Screening  Never done  ? ? ?The following portions of the patient's history were reviewed and updated as appropriate: allergies, current medications, past family history, past medical history, past social history, past surgical history, and problem list. ? ?Review of Systems ?Pertinent items are noted in HPI. ? ?Objective:  ?BP 115/76   Ht 5\' 3"  (1.6 m)   Wt 126 lb (57.2 kg)   LMP  (LMP Unknown)   Breastfeeding Yes   BMI 22.32 kg/m?   ? ?General:  alert  ? Breasts:  lactating  ?Lungs: clear to auscultation bilaterally  ?Heart:  regular rate and rhythm, S1, S2 normal, no murmur, click, rub or gallop  ?Abdomen: soft, non-tender; bowel sounds normal; no masses,  no organomegaly   ?Wound well approximated incision  ?GU exam:  normal  ?     ?Assessment:  ? ? 1. Family planning ?Has had pp coitus x 7 without protection and wants Plan B today. Last coitus today without condom. ?If PT neg today may have Plan B ? ?2. Postpartum  exam ?Micronor #13 I po daily to begin today ?Please counsel pt to take I ocp daily at same time and abstain/condoms next 7 days ? ? ?7 wk postpartum exam.  ? ?Plan:  ? ?Essential components of care per ACOG recommendations: ? ?1.  Mood and well being: Patient with positive depression screening today. Reviewed local resources for support.  ?- Patient tobacco use? No.   ?- hx of drug use? No.   ? ?2. Infant care and feeding:  ?-Patient currently breastmilk feeding? Yes. Discussed returning to work and pumping.  ?-Social determinants of health (SDOH) reviewed in EPIC. No concernsThe following needs were identified: depression; declines counseling ? ?3. Sexuality, contraception and birth spacing ?- Patient does not want a pregnancy in the next year.  Desired  family size is 1 children.  ?- Reviewed reproductive life planning. Reviewed options based on patient desire and reproductive life plan. Patient is interested in Oral Contraceptive. This was provided to the patient today.  if not why not clearly documented ? ?Risks, benefits, and typical effectiveness rates were reviewed.  Questions were answered.  Written information was also given to the patient to review.   ? ?The patient will follow up in  prn  for surveillance.  The patient was told to call with any further questions, or with any concerns about this method of contraception.  Emphasized use of condoms 100% of the time for STI prevention. ? ?Patient was offered ECP based on Unprotected sex within past 72 hours.  Patient is within 1 days of unprotected sex. Patient was offered ECP. Reviewed options and patient desired Plan B (levonorgestrel)  ? ?- Discussed birth spacing of 18 months ? ?4. Sleep and fatigue ?-Encouraged family/partner/community support of 4 hrs of uninterrupted sleep to help with mood and fatigue ? ?5. Physical Recovery  ?- Discussed patients delivery and complications. She describes her labor as mixed. ?- Patient had a C-section. Patient had a  0  laceration. Perineal healing reviewed. Patient expressed understanding ?- Patient has urinary incontinence? No. ?- Patient is safe to resume physical and sexual activity ? ?6.  Health Maintenance ?- HM due items addressed No - only depression ?- Last pap smear No results found for: DIAGPAP Pap smear not done at today's visit.  ?-Breast Cancer screening indicated? No.  ? ?7. Chronic Disease/Pregnancy Condition follow up: None ? ?- PCP follow up ? ?Alberteen Spindle, CNM ?[facility] for [employer], [facility] ? ? ?

## 2022-03-13 NOTE — Addendum Note (Signed)
Addended by: Burt Knack on: 03/13/2022 12:51 PM ? ? Modules accepted: Orders ? ?

## 2022-03-13 NOTE — Progress Notes (Signed)
PT negative, consents signed for Micronor and ECP. Patient aware to pick up later today. Counseled how to take OC effectively and patient plans to use alarm to remember to take OC at same time daily. Patient changed her mind about blood testing and desires HIV and Syphilis testing, orders added.Marland KitchenMarland KitchenMarland KitchenJenetta Downer, RN  ?

## 2022-03-15 ENCOUNTER — Encounter: Payer: Medicaid Other | Admitting: Obstetrics and Gynecology

## 2023-03-07 ENCOUNTER — Other Ambulatory Visit: Payer: Self-pay

## 2023-03-07 ENCOUNTER — Encounter: Payer: Self-pay | Admitting: Emergency Medicine

## 2023-03-07 ENCOUNTER — Emergency Department
Admission: EM | Admit: 2023-03-07 | Discharge: 2023-03-07 | Disposition: A | Discharge status: Home / Self Care | Payer: Medicaid Other | Attending: Emergency Medicine | Admitting: Emergency Medicine

## 2023-03-07 DIAGNOSIS — R197 Diarrhea, unspecified: Secondary | ICD-10-CM | POA: Diagnosis not present

## 2023-03-07 DIAGNOSIS — R1084 Generalized abdominal pain: Secondary | ICD-10-CM | POA: Diagnosis not present

## 2023-03-07 DIAGNOSIS — R112 Nausea with vomiting, unspecified: Secondary | ICD-10-CM | POA: Diagnosis present

## 2023-03-07 DIAGNOSIS — Z3491 Encounter for supervision of normal pregnancy, unspecified, first trimester: Secondary | ICD-10-CM

## 2023-03-07 DIAGNOSIS — N390 Urinary tract infection, site not specified: Secondary | ICD-10-CM

## 2023-03-07 DIAGNOSIS — E876 Hypokalemia: Secondary | ICD-10-CM | POA: Diagnosis not present

## 2023-03-07 LAB — URINALYSIS, ROUTINE W REFLEX MICROSCOPIC
Bacteria, UA: NONE SEEN
Bilirubin Urine: NEGATIVE
Glucose, UA: NEGATIVE mg/dL
Hgb urine dipstick: NEGATIVE
Ketones, ur: 5 mg/dL — AB
Leukocytes,Ua: NEGATIVE
Nitrite: NEGATIVE
Protein, ur: 100 mg/dL — AB
Specific Gravity, Urine: 1.031 — ABNORMAL HIGH (ref 1.005–1.030)
pH: 6 (ref 5.0–8.0)

## 2023-03-07 LAB — CBC WITH DIFFERENTIAL/PLATELET
Abs Immature Granulocytes: 0.02 10*3/uL (ref 0.00–0.07)
Basophils Absolute: 0 10*3/uL (ref 0.0–0.1)
Basophils Relative: 0 %
Eosinophils Absolute: 0 10*3/uL (ref 0.0–0.5)
Eosinophils Relative: 0 %
HCT: 35.3 % — ABNORMAL LOW (ref 36.0–46.0)
Hemoglobin: 11.9 g/dL — ABNORMAL LOW (ref 12.0–15.0)
Immature Granulocytes: 0 %
Lymphocytes Relative: 17 %
Lymphs Abs: 1.2 10*3/uL (ref 0.7–4.0)
MCH: 29.7 pg (ref 26.0–34.0)
MCHC: 33.7 g/dL (ref 30.0–36.0)
MCV: 88 fL (ref 80.0–100.0)
Monocytes Absolute: 0.3 10*3/uL (ref 0.1–1.0)
Monocytes Relative: 4 %
Neutro Abs: 5.9 10*3/uL (ref 1.7–7.7)
Neutrophils Relative %: 79 %
Platelets: 376 10*3/uL (ref 150–400)
RBC: 4.01 MIL/uL (ref 3.87–5.11)
RDW: 13.6 % (ref 11.5–15.5)
WBC: 7.5 10*3/uL (ref 4.0–10.5)
nRBC: 0 % (ref 0.0–0.2)

## 2023-03-07 LAB — COMPREHENSIVE METABOLIC PANEL
ALT: 16 U/L (ref 0–44)
AST: 17 U/L (ref 15–41)
Albumin: 4.2 g/dL (ref 3.5–5.0)
Alkaline Phosphatase: 67 U/L (ref 38–126)
Anion gap: 9 (ref 5–15)
BUN: 11 mg/dL (ref 6–20)
CO2: 25 mmol/L (ref 22–32)
Calcium: 9.1 mg/dL (ref 8.9–10.3)
Chloride: 103 mmol/L (ref 98–111)
Creatinine, Ser: 0.46 mg/dL (ref 0.44–1.00)
GFR, Estimated: >60 mL/min (ref >60)
Glucose, Bld: 111 mg/dL — ABNORMAL HIGH (ref 70–99)
Potassium: 2.9 mmol/L — ABNORMAL LOW (ref 3.5–5.1)
Sodium: 137 mmol/L (ref 135–145)
Total Bilirubin: 0.6 mg/dL (ref 0.3–1.2)
Total Protein: 7.9 g/dL (ref 6.5–8.1)

## 2023-03-07 LAB — LIPASE, BLOOD: Lipase: 28 U/L (ref 11–51)

## 2023-03-07 LAB — MAGNESIUM: Magnesium: 1.9 mg/dL (ref 1.7–2.4)

## 2023-03-07 LAB — POC URINE PREG, ED: Preg Test, Ur: POSITIVE — AB

## 2023-03-07 MED ORDER — CEPHALEXIN 500 MG PO CAPS: 500.0000 mg | ORAL_CAPSULE | Freq: Two times a day (BID) | ORAL | 0 refills | Status: DC

## 2023-03-07 MED ORDER — ONDANSETRON 4 MG PO TBDP: 4.0000 mg | ORAL_TABLET | Freq: Three times a day (TID) | ORAL | 0 refills | Status: DC | PRN

## 2023-03-07 MED ORDER — POTASSIUM CHLORIDE CRYS ER 20 MEQ PO TBCR
40.0000 meq | EXTENDED_RELEASE_TABLET | Freq: Once | ORAL | Status: AC
  Administered 2023-03-07: 40 meq via ORAL
  Filled 2023-03-07: qty 2

## 2023-03-07 MED ORDER — ONDANSETRON HCL 4 MG/2ML IJ SOLN
4.0000 mg | Freq: Once | INTRAMUSCULAR | Status: AC
  Administered 2023-03-07: 4 mg via INTRAVENOUS
  Filled 2023-03-07: qty 2

## 2023-03-07 MED ORDER — SODIUM CHLORIDE 0.9 % IV BOLUS (SEPSIS)
1000.0000 mL | Freq: Once | INTRAVENOUS | Status: AC
  Administered 2023-03-07: 1000 mL via INTRAVENOUS

## 2023-03-07 MED ORDER — KETOROLAC TROMETHAMINE 30 MG/ML IJ SOLN
30.0000 mg | Freq: Once | INTRAMUSCULAR | Status: AC
  Administered 2023-03-07: 30 mg via INTRAVENOUS
  Filled 2023-03-07: qty 1

## 2023-03-07 MED ORDER — PRENATAL PLUS VITAMIN/MINERAL 27-1 MG PO TABS: 1.0000 | ORAL_TABLET | Freq: Every day | ORAL | 3 refills | Status: DC

## 2023-03-07 NOTE — ED Notes (Signed)
Pt given cereal 

## 2023-03-07 NOTE — ED Triage Notes (Signed)
Patient ambulatory to triage with steady gait, without difficulty or distress noted; pt reports HA, generalized abd pain, N/V/D

## 2023-03-07 NOTE — ED Provider Notes (Signed)
[facility] Provider Note    Event Date/Time   First MD Initiated Contact with Patient 03/07/23 [phone]     (approximate)   History   Emesis   HPI  Pamela Friedman is a 22 y.o. female with history of depression, anemia who presents to the emergency department for generalized abdominal pain, nausea, vomiting and diarrhea for 3 days.  No fevers, dysuria, hematuria, vaginal bleeding or discharge.  Has had previous C-section.  No known sick contacts.   History provided by patient.    Past Medical History:  Diagnosis Date   Anemia    Mental disorder    hx depression, suicidal thoughts    Past Surgical History:  Procedure Laterality Date   CESAREAN SECTION  01/18/2022   Procedure: CESAREAN SECTION;  Surgeon: Linzie Collin, MD;  Location: [facility] ORS;  Service: Obstetrics;;   denies     NO PAST SURGERIES      MEDICATIONS:  Prior to Admission medications   Medication Sig Start Date End Date Taking? Authorizing Provider  ibuprofen (ADVIL) 600 MG tablet Take 1 tablet (600 mg total) by mouth every 6 (six) hours as needed for mild pain. Patient not taking: Reported on 03/13/2022 01/20/22   Hildred Laser, MD  norethindrone (MICRONOR) 0.35 MG tablet Take 1 tablet (0.35 mg total) by mouth daily. 03/13/22   Sciora, Austin Miles, CNM  Prenatal Vit-Fe Fumarate-FA (PRENATAL PLUS VITAMIN/MINERAL) 27-1 MG TABS Take 1 tablet by mouth daily. Patient not taking: Reported on 03/13/2022 01/30/22   Linzie Collin, MD  Prenatal Vit-Fe Fumarate-FA (PRENATAL VITAMIN) 27-0.8 MG TABS Take 1 tablet by mouth daily. Patient not taking: Reported on 03/13/2022 09/05/21   Federico Flake, MD    Physical Exam   Triage Vital Signs: ED Triage Vitals  Enc Vitals Group     BP 03/07/23 0608 114/72     Pulse Rate 03/07/23 0608 65     Resp 03/07/23 0608 16     Temp 03/07/23 0608 98.2 F (36.8 C)     Temp Source 03/07/23 0608 Oral     SpO2 03/07/23 0608 100 %     Weight  03/07/23 0604 110 lb (49.9 kg)     Height 03/07/23 0604  (1.6 m)     Head Circumference --      Peak Flow --      Pain Score 03/07/23 0604 8     Pain Loc --      Pain Edu? --      Excl. in GC? --     Most recent vital signs: Vitals:   03/07/23 0608 03/07/23 0615  BP: 114/72   Pulse: 65 (!) 57  Resp: 16   Temp: 98.2 F (36.8 C)   SpO2: 100% 100%    CONSTITUTIONAL: Alert, responds appropriately to questions. Well-appearing; well-nourished HEAD: Normocephalic, atraumatic EYES: Conjunctivae clear, pupils appear equal, sclera nonicteric ENT: normal nose; moist mucous membranes NECK: Supple, normal ROM CARD: RRR; S1 and S2 appreciated RESP: Normal chest excursion without splinting or tachypnea; breath sounds clear and equal bilaterally; no wheezes, no rhonchi, no rales, no hypoxia or respiratory distress, speaking full sentences ABD/GI: Non-distended; soft, mild tenderness throughout the abdomen, no rebound, no guarding, no peritoneal signs; no specific tenderness at McBurney's point and negative Murphy sign BACK: The back appears normal EXT: Normal ROM in all joints; no deformity noted, no edema SKIN: Normal color for age and race; warm; no rash on exposed skin NEURO: Moves  all extremities equally, normal speech PSYCH: The patient's mood and manner are appropriate.   ED Results / Procedures / Treatments   LABS: (all labs ordered are listed, but only abnormal results are displayed) Labs Reviewed  CBC WITH DIFFERENTIAL/PLATELET - Abnormal; Notable for the following components:      Result Value   Hemoglobin 11.9 (*)    HCT 35.3 (*)    All other components within normal limits  COMPREHENSIVE METABOLIC PANEL - Abnormal; Notable for the following components:   Potassium 2.9 (*)    Glucose, Bld 111 (*)    All other components within normal limits  LIPASE, BLOOD  URINALYSIS, ROUTINE W REFLEX MICROSCOPIC  POC URINE PREG, ED     EKG:     RADIOLOGY: My personal  review and interpretation of imaging:    I have personally reviewed all radiology reports.   No results found.   PROCEDURES:  Critical Care performed: No    Procedures    IMPRESSION / MDM / ASSESSMENT AND PLAN / ED COURSE  I reviewed the triage vital signs and the nursing notes.    Patient here with nausea, vomiting and diarrhea.     DIFFERENTIAL DIAGNOSIS (includes but not limited to):   Suspect viral gastroenteritis, differential also includes dehydration, low suspicion for appendicitis, cholecystitis, pancreatitis, bowel obstruction given benign exam   Patient's presentation is most consistent with acute complicated illness / injury requiring diagnostic workup.   PLAN: Will obtain abdominal labs, urine.  Will give IV fluids, Toradol, Zofran for symptomatic relief and then p.o. challenge.  No indication for emergent abdominal imaging currently.   MEDICATIONS GIVEN IN ED: Medications  sodium chloride 0.9 % bolus 1,000 mL (1,000 mLs Intravenous New Bag/Given 03/07/23 0960)  potassium chloride SA (KLOR-CON M) CR tablet 40 mEq (has no administration in time range)  ondansetron (ZOFRAN) injection 4 mg (4 mg Intravenous Given 03/07/23 4540)  ketorolac (TORADOL) 30 MG/ML injection 30 mg (30 mg Intravenous Given 03/07/23 0631)     ED COURSE: Labs show no leukocytosis.  Potassium level of 2.9.  Will give oral replacement.  Normal LFTs and lipase.  Will add on magnesium level.  Will check EKG for interval abnormalities.  Urine pending.  Signed out to oncoming ED physician at 7 AM.   CONSULTS:  pending   OUTSIDE RECORDS REVIEWED: Reviewed last OB/GYN note on 01/30/2022.       FINAL CLINICAL IMPRESSION(S) / ED DIAGNOSES   Final diagnoses:  Nausea vomiting and diarrhea  Hypokalemia     Rx / DC Orders   ED Discharge Orders     None        Note:  This document was prepared using Dragon voice recognition software and may include unintentional dictation  errors.   Jerine Surles, Layla Maw, DO 03/07/23 0700

## 2023-03-07 NOTE — ED Provider Notes (Signed)
Procedures     ----------------------------------------- 8:29 AM on 03/07/2023 ----------------------------------------- Patient informed of positive pregnancy test.  She is tolerating p.o. with liquids and cereal with milk.  Feeling better.  She will take prenatal vitamins while she considers options and whether she wishes to continue this pregnancy given that she just gave birth last year.  Informed patient of limitations to abortion access that have evolved in [place].   Sharman Cheek, MD 03/07/23 [phone]

## 2023-03-07 NOTE — Discharge Instructions (Addendum)
Your urine test shows signs of a bladder infection. Take keflex as prescribed to treat this infection.  Start taking prenatal vitamins and follow up with gynecology

## 2023-03-28 IMAGING — US US OB COMP +14 WK
1 series · 15 of 28 positions shown · non-contrast
Comparison: none

CLINICAL DATA: Scan for anatomy. By EDC of 01/25/2022 patient is 19
weeks 6 days.

EXAM:
OBSTETRICAL ULTRASOUND >14 WKS

[Series 1: us ob comp + 14 wk · 77 acquisitions, 15 frames shown]
[im 1/77]
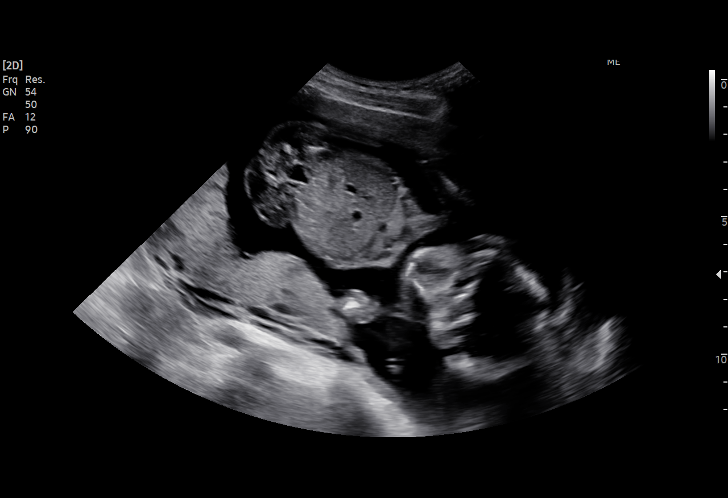
[im 6/77]
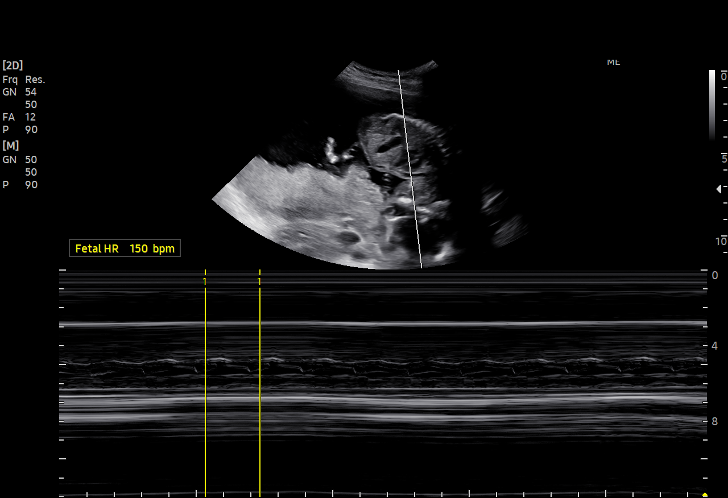
[im 12/77]
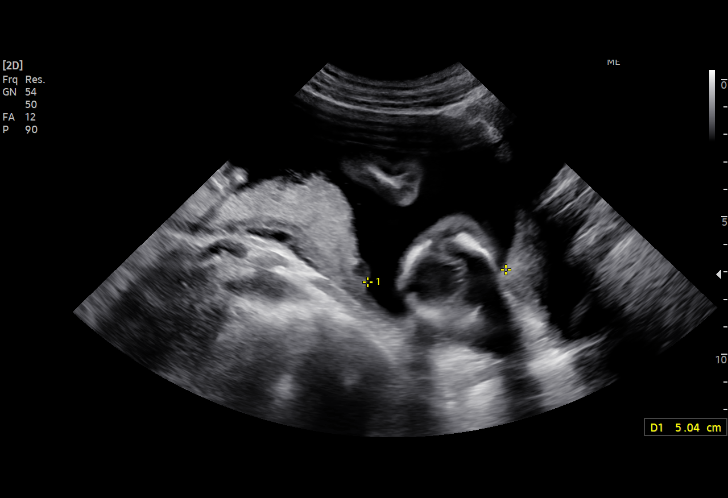
[im 17/77]
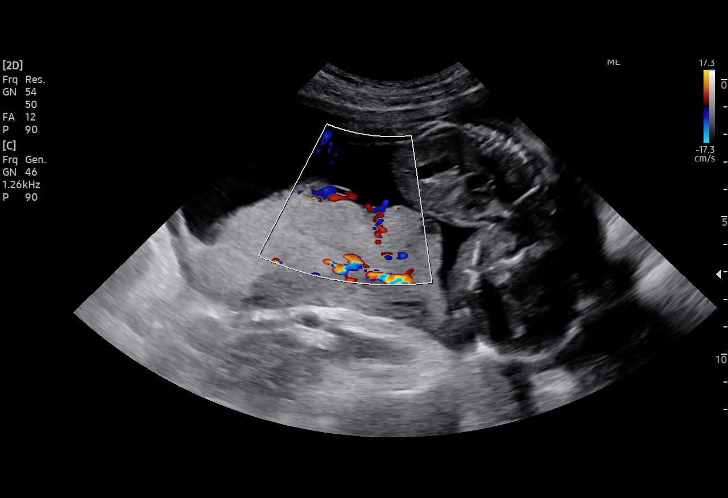
[im 23/77]
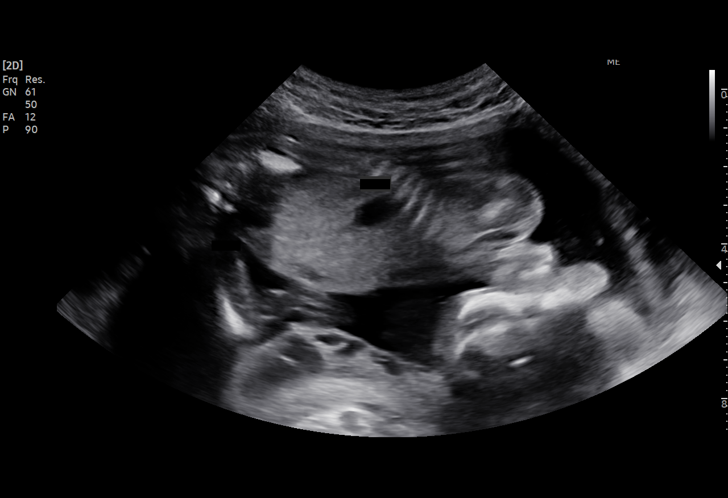
[im 29/77]
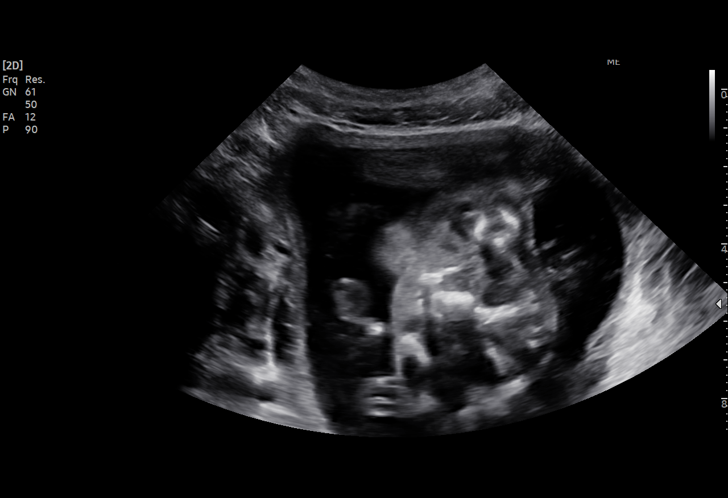
[im 34/77]
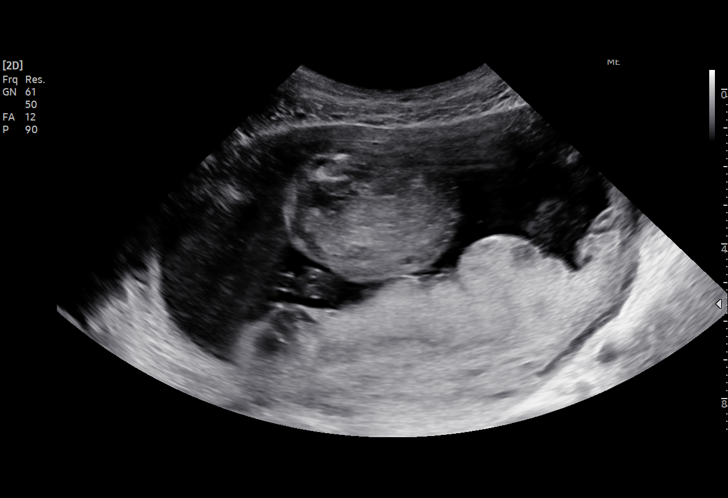
[im 40/77]
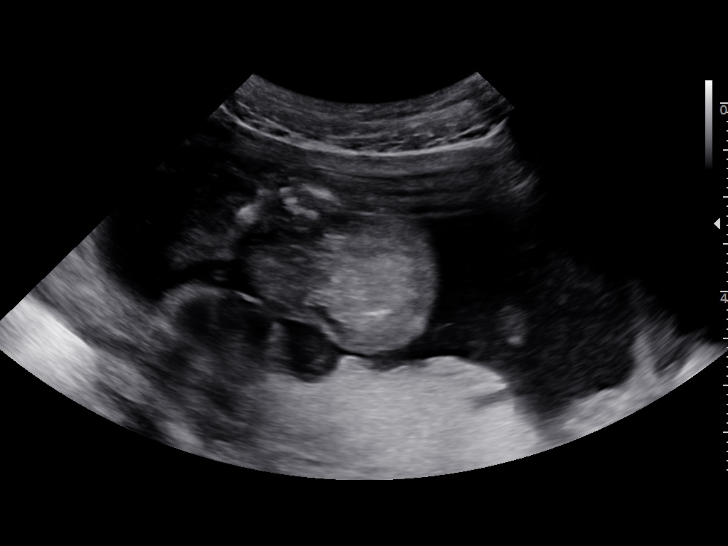
[im 43/77]
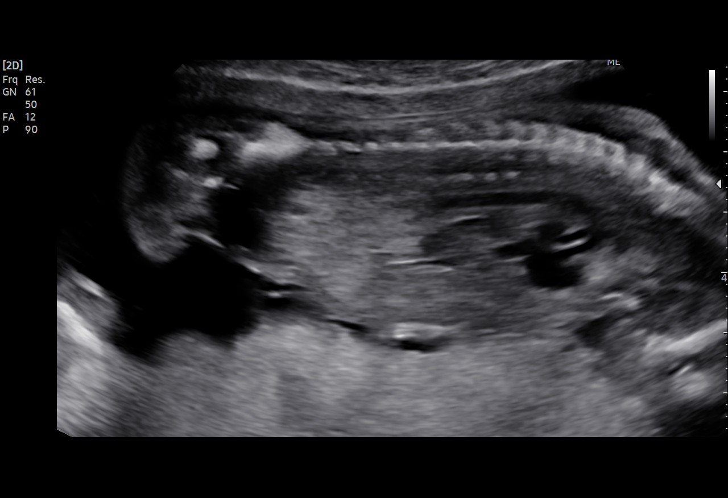
[im 48/77]
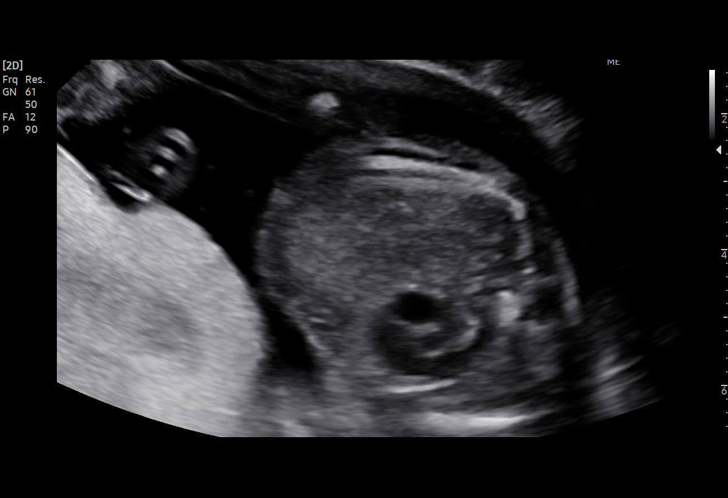
[im 54/77]
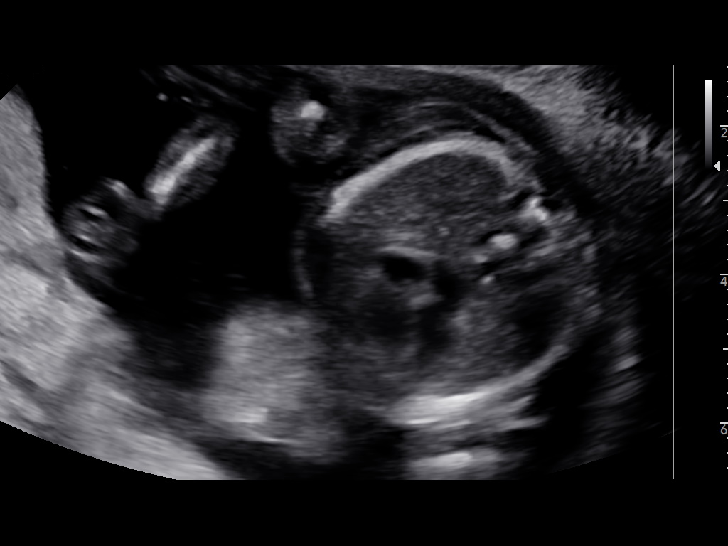
[im 60/77]
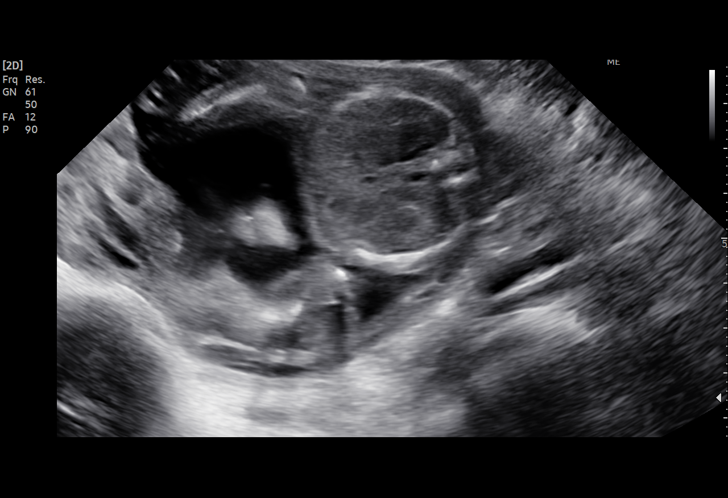
[im 65/77]
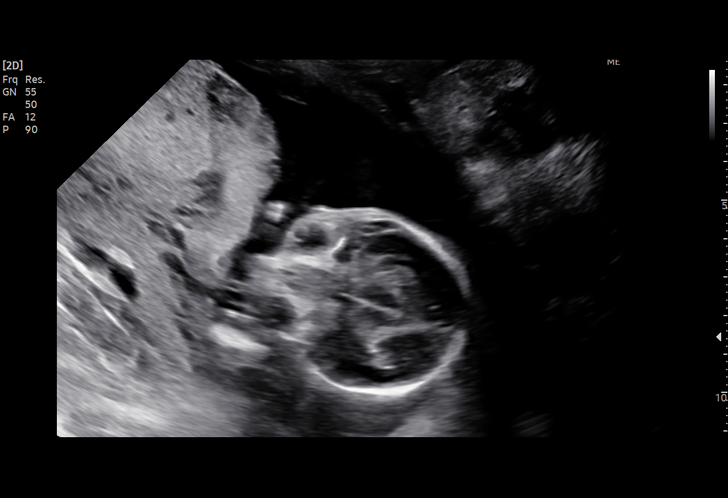
[im 71/77]
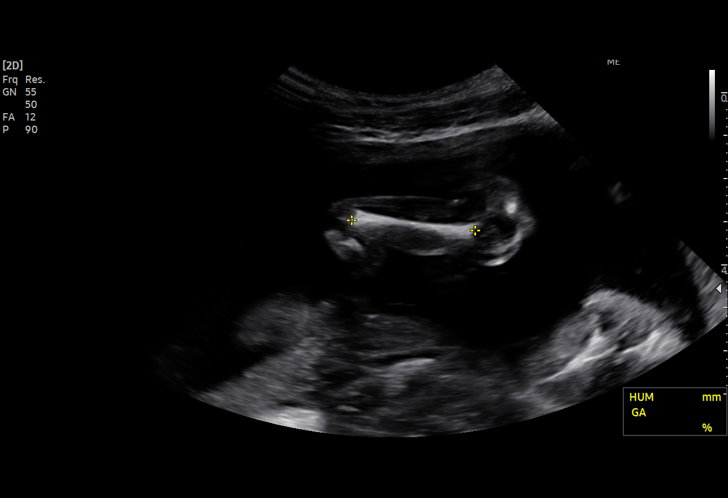
[im 77/77]
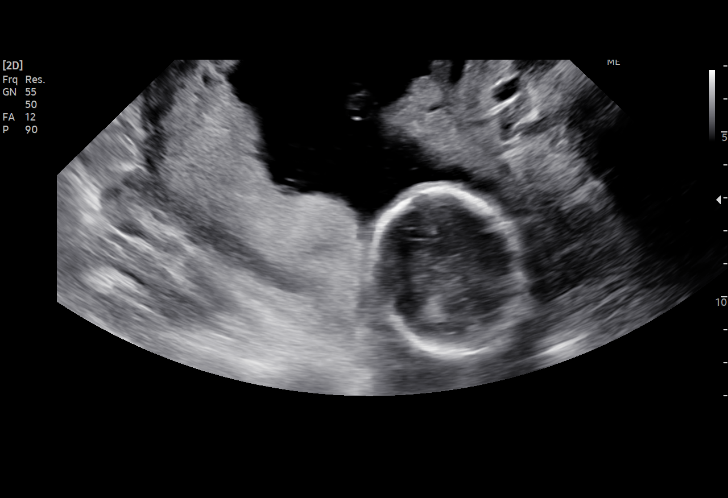

[15 of 28 positions shown; findings below may reference images not displayed]

FINDINGS: Number of Fetuses: 1

Heart Rate:  150 bpm

Movement: Present

Presentation: Cephalic

Previa: None

Placental Location: Posterior

Amniotic Fluid (Subjective): Normal

Amniotic Fluid (Objective):

Vertical pocket = 4.1cm

FETAL BIOMETRY

BPD: 4.4cm 19w 2d

HC:   16.3cm 19w 0d

AC:   14.0cm 19w 3d

FL:   2.9cm 19w 0d

Current Mean GA: 19w 1d US EDC: 01/30/2022

Assigned GA:  19w 6d Assigned EDC: 01/25/2022

FETAL ANATOMY

Lateral Ventricles: Appears normal

Thalami/CSP: Not visualized

Posterior Fossa:  Not visualized

Nuchal Region: Not visualized   NFT= not applicable

Upper Lip: Appears normal

Spine: Appears normal

4 Chamber Heart on Left: Appears normal

LVOT: Appears normal

RVOT: Appears normal

Stomach on Left: Appears normal

3 Vessel Cord: Appears normal

Cord Insertion site: Appears normal

Kidneys: Appears normal

Bladder: Appears normal

Extremities: Appears normal

Technically difficult due to: Fetal position.

Maternal Findings:

Cervix:  3.1 centimeters on transabdominal evaluation
IMPRESSION: 1. Single living intrauterine fetus in cephalic presentation.
2. Size and dates correlate within 5 days.
3. Visualized anatomic survey is unremarkable.
4. Survey of the brain is limited due to head position. Recommend
follow-up.

## 2023-08-05 IMAGING — US US MFM OB FOLLOW-UP
1 series · 13 of 28 positions shown · non-contrast
Comparison: none

[Series 1: us mfm ob follow-up · 42 acquisitions, 13 frames shown]
[im 2/42]
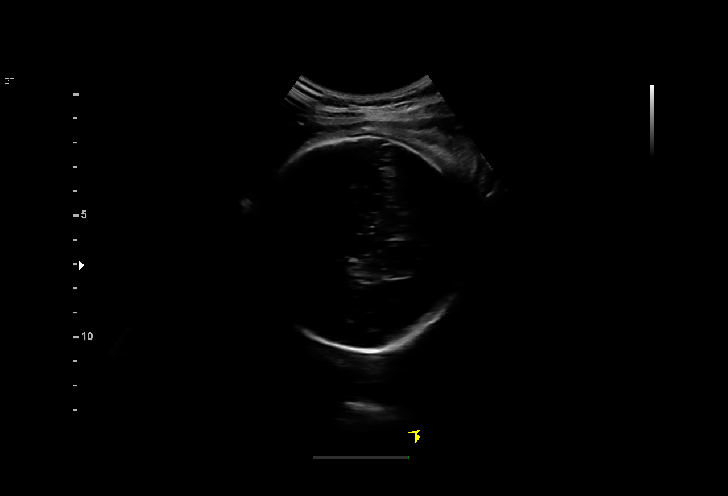
[im 5/42]
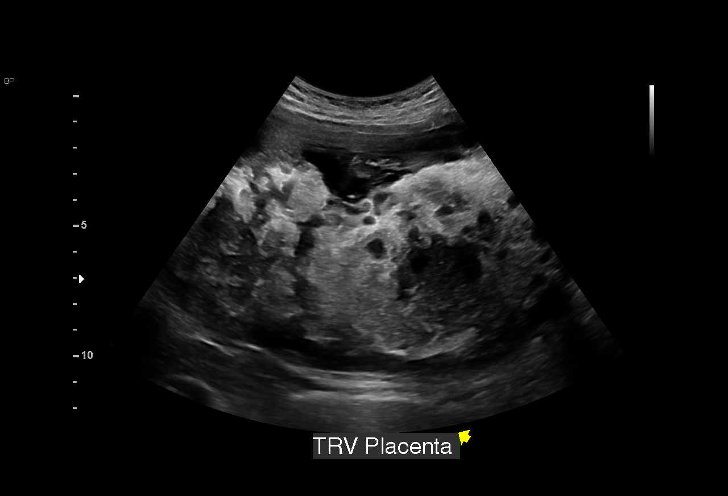
[im 8/42]
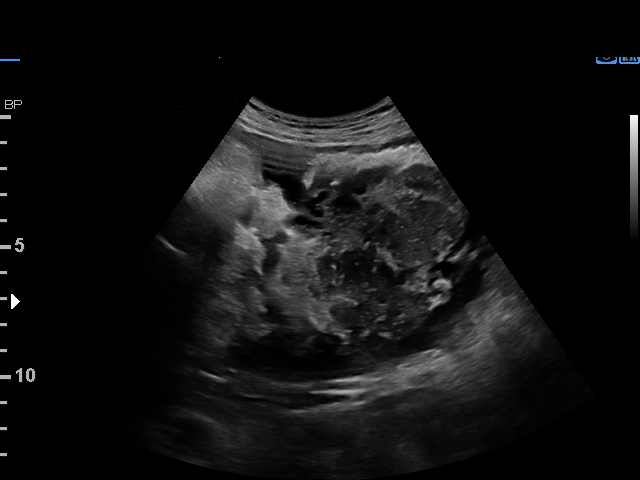
[im 11/42]
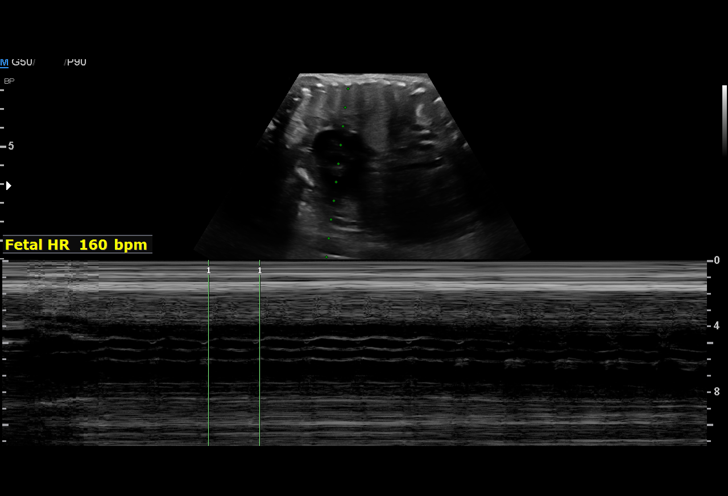
[im 14/42]
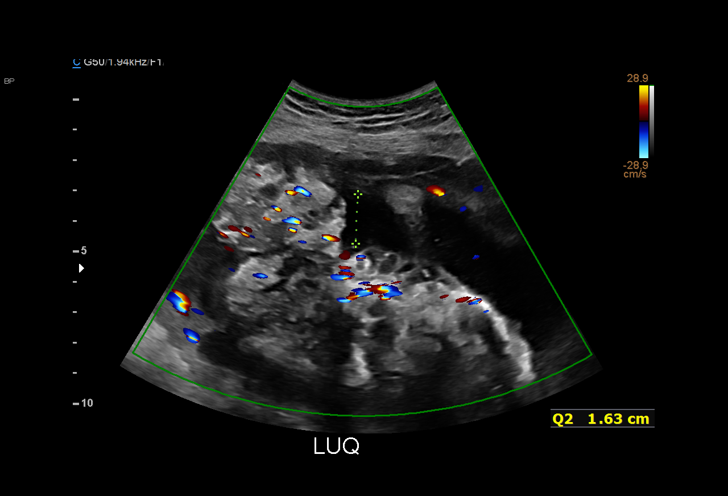
[im 17/42]
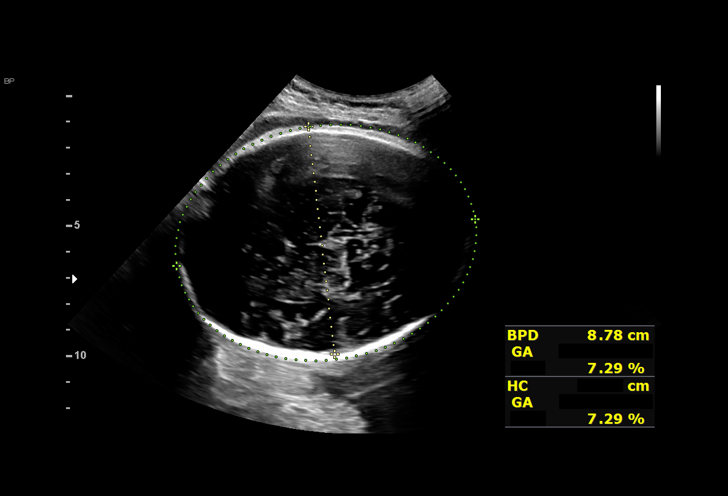
[im 22/42]
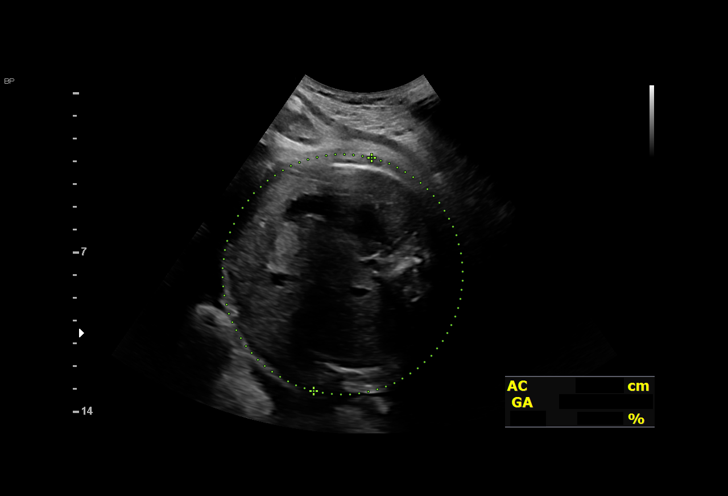
[im 25/42]
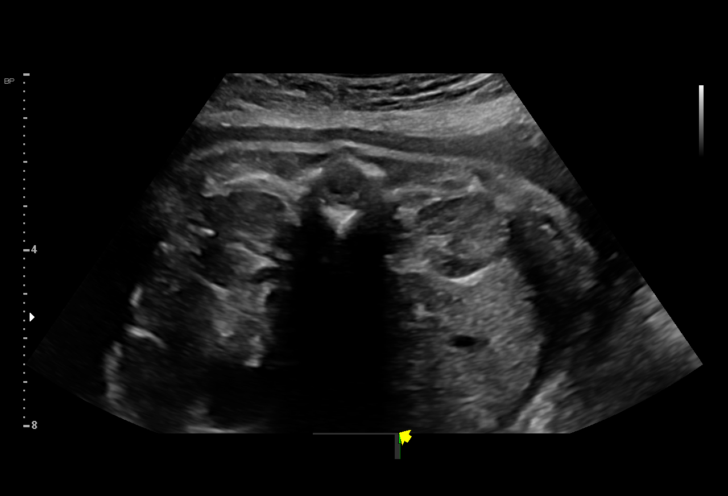
[im 28/42]
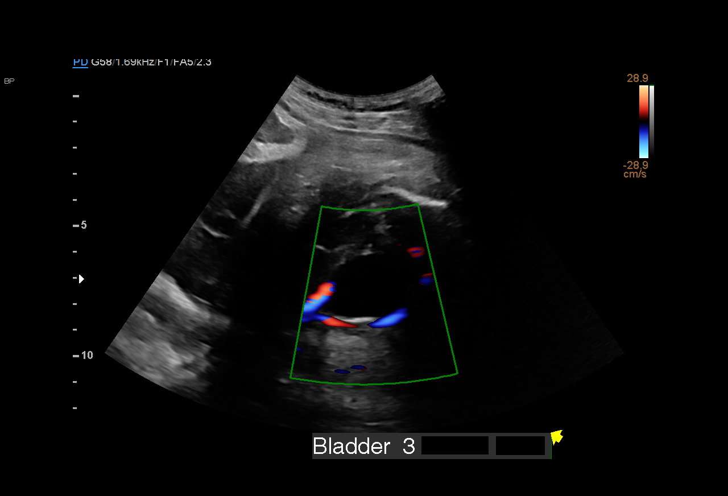
[im 31/42]
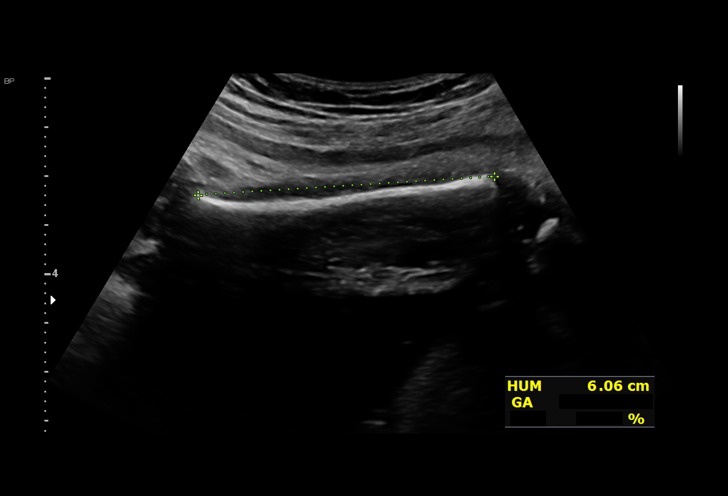
[im 34/42]
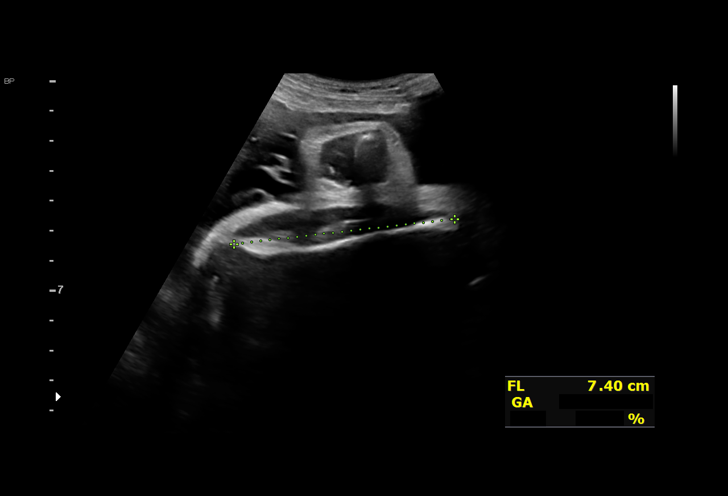
[im 37/42]
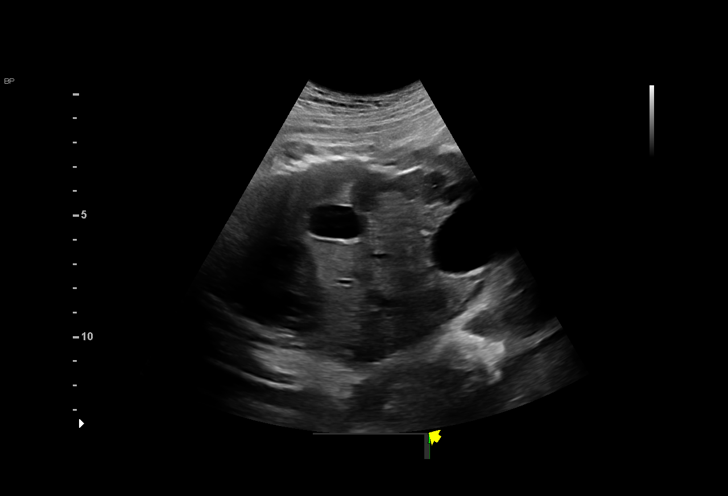
[im 40/42]
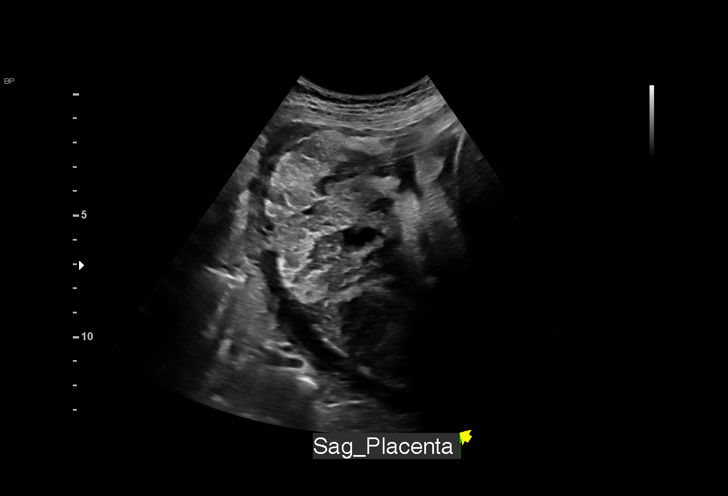

[13 of 28 positions shown; findings below may reference images not displayed]

MANUEL ISMAEL CNM

Indications

 Tobacco use complicating pregnancy, third
 trimester (vaping)
 Breech presentation
 Echogenic intracardiac focus of the heart
 (EIF)
 Drug use complicating pregnancy, third
 trimester (THC)
 38 weeks gestation of pregnancy
Fetal Evaluation

 Num Of Fetuses:         1
 Fetal Heart Rate(bpm):  160
 Cardiac Activity:       Observed
 Presentation:           Breech
 Placenta:               Posterior
 P. Cord Insertion:      Previously Visualized

 Amniotic Fluid
 AFI FV:      Within normal limits

 AFI Sum(cm)     %Tile       Largest Pocket(cm)
 10.13           28

 RUQ(cm)       RLQ(cm)       LUQ(cm)        LLQ(cm)
 0
Biometry
 BPD:      87.7  mm     G. Age:  35w 3d          7  %    CI:        73.16   %    70 - 86
                                                         FL/HC:      22.6   %    20.9 -
 HC:      325.9  mm     G. Age:  37w 0d          8  %    HC/AC:      0.98        0.92 -
 AC:      332.3  mm     G. Age:  37w 1d         33  %    FL/BPD:     84.0   %    71 - 87
 FL:       73.7  mm     G. Age:  37w 5d         36  %    FL/AC:      22.2   %    20 - 24
 HUM:      60.6  mm     G. Age:  35w 1d         13  %
 LV:        4.7  mm

 Est. FW:    7148  gm    6 lb 13 oz      30  %
OB History

 Gravidity:    1
 Living:       0
Gestational Age

 LMP:           38w 3d        Date:  04/20/21                 EDD:   01/25/22
 U/S Today:     36w 6d                                        EDD:   02/05/22
 Best:          38w 3d     Det. By:  LMP  (04/20/21)          EDD:   01/25/22
Anatomy

 Cranium:               Appears normal         LVOT:                   Previously seen
 Cavum:                 Appears normal         Aortic Arch:            Previously seen
 Ventricles:            Appears normal         Ductal Arch:            Previously seen
 Choroid Plexus:        Previously seen        Diaphragm:              Appears normal
 Cerebellum:            Previously seen        Stomach:                Appears normal, left
                                                                       sided
 Posterior Fossa:       Previously seen        Abdomen:                Previously seen
 Nuchal Fold:           Not applicable (>20    Abdominal Wall:         Previously seen
                        wks GA)
 Face:                  Orbits and profile     Cord Vessels:           Appears normal (3
                        previously seen                                vessel cord)
 Lips:                  Previously seen        Kidneys:                Appear normal
 Palate:                Previously seen        Bladder:                Appears normal
 Thoracic:              Previously seen        Spine:                  Limited views
                                                                       previously seen
 Heart:                 Previously seen; EIF   Upper Extremities:      Previously seen
 RVOT:                  Previously seen        Lower Extremities:      Previously seen

 Other:  Nasal bone, Lenses, Hands and feet visualized previously. S-spine
         not well visualized previously. Technically difficult due to advanced
         GA and fetal position.
Cervix Uterus Adnexa

 Cervix
 Not visualized (advanced GA >06wks)

 Uterus
 Normal shape and size.

 Right Ovary
 Not visualized.
 Left Ovary
 Not visualized.
Impression

 Patient returned for fetal growth assessment.  She does not
 have gestational diabetes.  Blood pressure today at her office
 is 127/77 mmHg.

 Amniotic fluid is normal good fetal activity seen.  Fetal growth
 is appropriate for gestational age.  Frank breech presentation
 is seen.

 I counseled the patient on breech presentation.  I discussed
 external cephalic version (ECV) and possible complication of
 abruption (rare).  Success rate of ECV is about 50% to 60%.

 Patient has a prenatal visit appointment tomorrow with you.
Recommendations

 -No follow-up appointments were made .
                 [place], Reba

## 2023-08-12 ENCOUNTER — Ambulatory Visit: Payer: MEDICAID | Admitting: Pediatrics

## 2023-09-03 ENCOUNTER — Ambulatory Visit: Payer: MEDICAID | Admitting: Pediatrics

## 2023-10-07 NOTE — Progress Notes (Signed)
Patient walked in needing a new OB appointment. Here with FOB mother and patient's 80 month old daughter. She states her last period was sometime last March. She believes she is due around mid to late December. No prenatal care, not taking PNV. Counseled to buy OTC prenatal vitamins and start taking asap. Patient is scheduled in a split appointment for nurse portion of new OB (next week) and then provider portion (week after next.). Sent to clerical for pre-admit paperwork. Burt Knack, RN

## 2023-10-14 ENCOUNTER — Encounter: Payer: Self-pay | Admitting: Advanced Practice Midwife

## 2023-10-14 ENCOUNTER — Ambulatory Visit: Payer: MEDICAID | Admitting: Advanced Practice Midwife

## 2023-10-14 VITALS — BP 122/67 | HR 88 | Temp 97.9°F | Wt 127.2 lb

## 2023-10-14 DIAGNOSIS — Z98891 History of uterine scar from previous surgery: Secondary | ICD-10-CM | POA: Insufficient documentation

## 2023-10-14 DIAGNOSIS — O0930 Supervision of pregnancy with insufficient antenatal care, unspecified trimester: Secondary | ICD-10-CM

## 2023-10-14 DIAGNOSIS — O2343 Unspecified infection of urinary tract in pregnancy, third trimester: Secondary | ICD-10-CM | POA: Diagnosis not present

## 2023-10-14 DIAGNOSIS — O2340 Unspecified infection of urinary tract in pregnancy, unspecified trimester: Secondary | ICD-10-CM | POA: Insufficient documentation

## 2023-10-14 DIAGNOSIS — O0933 Supervision of pregnancy with insufficient antenatal care, third trimester: Secondary | ICD-10-CM | POA: Diagnosis not present

## 2023-10-14 DIAGNOSIS — Z8659 Personal history of other mental and behavioral disorders: Secondary | ICD-10-CM

## 2023-10-14 DIAGNOSIS — O9A319 Physical abuse complicating pregnancy, unspecified trimester: Secondary | ICD-10-CM

## 2023-10-14 DIAGNOSIS — O99013 Anemia complicating pregnancy, third trimester: Secondary | ICD-10-CM | POA: Diagnosis not present

## 2023-10-14 DIAGNOSIS — Z23 Encounter for immunization: Secondary | ICD-10-CM

## 2023-10-14 DIAGNOSIS — Z3483 Encounter for supervision of other normal pregnancy, third trimester: Secondary | ICD-10-CM

## 2023-10-14 DIAGNOSIS — O99019 Anemia complicating pregnancy, unspecified trimester: Secondary | ICD-10-CM

## 2023-10-14 LAB — OB RESULTS CONSOLE RUBELLA ANTIBODY, IGM: Rubella: IMMUNE

## 2023-10-14 LAB — OB RESULTS CONSOLE GC/CHLAMYDIA
Chlamydia: NEGATIVE
Neisseria Gonorrhea: NEGATIVE

## 2023-10-14 LAB — OB RESULTS CONSOLE VARICELLA ZOSTER ANTIBODY, IGG: Varicella: IMMUNE

## 2023-10-14 LAB — OB RESULTS CONSOLE HEPATITIS B SURFACE ANTIGEN: Hepatitis B Surface Ag: NEGATIVE

## 2023-10-14 LAB — WET PREP FOR TRICH, YEAST, CLUE: Trichomonas Exam: NEGATIVE

## 2023-10-14 LAB — OB RESULTS CONSOLE HIV ANTIBODY (ROUTINE TESTING): HIV: NONREACTIVE

## 2023-10-14 LAB — HEPATITIS C ANTIBODY: HCV Ab: NEGATIVE

## 2023-10-14 LAB — OB RESULTS CONSOLE RPR: RPR: NONREACTIVE

## 2023-10-14 LAB — HEMOGLOBIN, FINGERSTICK: Hemoglobin: 10.7 g/dL — ABNORMAL LOW (ref 11.1–15.9)

## 2023-10-14 MED ORDER — PRENATAL VITAMIN 27-0.8 MG PO TABS
1.0000 | ORAL_TABLET | Freq: Every day | ORAL | 11 refills | Status: AC
Start: 2023-10-14 — End: ?

## 2023-10-14 MED ORDER — CLOTRIMAZOLE 1 % VA CREA
1.0000 | TOPICAL_CREAM | Freq: Every day | VAGINAL | 0 refills | Status: DC
Start: 2023-10-14 — End: 2023-10-14

## 2023-10-14 MED ORDER — IRON (FERROUS SULFATE) 325 (65 FE) MG PO TABS
1.0000 | ORAL_TABLET | Freq: Every day | ORAL | Status: AC
Start: 2023-10-14 — End: ?

## 2023-10-14 MED ORDER — CLOTRIMAZOLE 1 % VA CREA
1.0000 | TOPICAL_CREAM | Freq: Every day | VAGINAL | Status: DC
Start: 2023-10-14 — End: 2023-10-22

## 2023-10-14 NOTE — Progress Notes (Addendum)
Presents for initiation of prenatal care. Currently not taking PNV as supply was exhausted 2 - 3 months ago. To [facility] ED for evaluation of N/V 03/07/23. IV fluids and Zofran received. Reports treated for a UTI (Cephalexin - states thinks took all of medicine). Denies any other medical care this pregnancy. Desires MaterniT-21 testing today. As prior travel to [place] and [place], Quantiferon TB Gold test today. Counseled on recommendation for flu vaccine in pregnancy and for those 66 months of age and older. Vaccine declined. Counseled on recommendation for Tdap in pregnancy and for those who will spend any amt of time with infant. Vaccine accepted. Counseled on recommendation for RSV vaccine in pregnancy and vaccine declined. VISs for all 3 vaccines given. Jossie Ng, RN Per consult with Hazle Coca CNM, proceed with care needed at 28 week labs. Tdap tolerated without complaint. With approximately 30 cc of glucola remaining in bottle, client vomited large amt into trash can. Glucola not repeated today and appt for 1 hr GTT scheduled in Nurse Clinic tomorrow with reminder card given. 1 week [facility] RV appt scheduled and reminder card given. Jossie Ng, RN OB US referral faxed to [facility] with snapshot pages and fax confirmation received. Client counseled [facility] will likely call her with appt, but [facility] would verify appt scheduled. Hbg = 10.7 and treated for anemia per standing order. Wet prep reviewed and with many yeast. Treated per standing order. Jossie Ng, RN
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
## 2023-10-15 ENCOUNTER — Other Ambulatory Visit: Payer: MEDICAID

## 2023-10-15 ENCOUNTER — Other Ambulatory Visit: Payer: Self-pay

## 2023-10-15 DIAGNOSIS — Z3483 Encounter for supervision of other normal pregnancy, third trimester: Secondary | ICD-10-CM

## 2023-10-15 LAB — FE+CBC/D/PLT+TIBC+FER+RETIC
Basophils Absolute: 0 10*3/uL (ref 0.0–0.2)
Basos: 0 %
EOS (ABSOLUTE): 0.1 10*3/uL (ref 0.0–0.4)
Eos: 1 %
Ferritin: 9 ng/mL — ABNORMAL LOW (ref 15–150)
Hematocrit: 33.6 % — ABNORMAL LOW (ref 34.0–46.6)
Hemoglobin: 10.8 g/dL — ABNORMAL LOW (ref 11.1–15.9)
Immature Grans (Abs): 0.3 10*3/uL — ABNORMAL HIGH (ref 0.0–0.1)
Immature Granulocytes: 2 %
Iron Saturation: 6 % — CL (ref 15–55)
Iron: 37 ug/dL (ref 27–159)
Lymphocytes Absolute: 1.2 10*3/uL (ref 0.7–3.1)
Lymphs: 10 %
MCH: 28.8 pg (ref 26.6–33.0)
MCHC: 32.1 g/dL (ref 31.5–35.7)
MCV: 90 fL (ref 79–97)
Monocytes Absolute: 0.9 10*3/uL (ref 0.1–0.9)
Monocytes: 7 %
Neutrophils Absolute: 10 10*3/uL — ABNORMAL HIGH (ref 1.4–7.0)
Neutrophils: 80 %
Platelets: 555 10*3/uL — ABNORMAL HIGH (ref 150–450)
RBC: 3.75 x10E6/uL — ABNORMAL LOW (ref 3.77–5.28)
RDW: 13 % (ref 11.7–15.4)
Retic Ct Pct: 1.7 % (ref 0.6–2.6)
Total Iron Binding Capacity: 592 ug/dL (ref 250–450)
UIBC: 555 ug/dL — ABNORMAL HIGH (ref 131–425)
WBC: 12.4 10*3/uL — ABNORMAL HIGH (ref 3.4–10.8)

## 2023-10-15 NOTE — Progress Notes (Signed)
In nurse clinic for 1hr GTT. Patient previously vomited during GTT testing 10/14/23. Patient reports she had not eaten before test today. Instructions provided. During test, patient vomited. Consulted with Hazle Coca, CNM who advises RN that patient will need to come back for restesting on 10/20/23. Advised patient that she will need to eat a good breakfast and lunch before testing. Patient also given prescription for Phenergan (should not drive self to appt) and advised to take 1hr before appt time. Patient agrees and scheduled for 10/20/23 at 1PM.   Abagail Kitchens, RN

## 2023-10-17 LAB — CULTURE, BETA STREP (GROUP B ONLY): Strep Gp B Culture: POSITIVE — AB

## 2023-10-18 LAB — PAP IG (IMAGE GUIDED): PAP Smear Comment: 0

## 2023-10-19 ENCOUNTER — Other Ambulatory Visit: Payer: Self-pay

## 2023-10-19 ENCOUNTER — Encounter: Payer: Self-pay | Admitting: Obstetrics and Gynecology

## 2023-10-19 ENCOUNTER — Observation Stay
Admission: EM | Admit: 2023-10-19 | Discharge: 2023-10-20 | Disposition: A | Discharge status: Home / Self Care | Payer: MEDICAID | Attending: Obstetrics and Gynecology | Admitting: Obstetrics and Gynecology

## 2023-10-19 DIAGNOSIS — Z7722 Contact with and (suspected) exposure to environmental tobacco smoke (acute) (chronic): Secondary | ICD-10-CM | POA: Insufficient documentation

## 2023-10-19 DIAGNOSIS — Z3A37 37 weeks gestation of pregnancy: Secondary | ICD-10-CM | POA: Insufficient documentation

## 2023-10-19 DIAGNOSIS — Z79899 Other long term (current) drug therapy: Secondary | ICD-10-CM | POA: Insufficient documentation

## 2023-10-19 DIAGNOSIS — O479 False labor, unspecified: Principal | ICD-10-CM | POA: Diagnosis present

## 2023-10-19 DIAGNOSIS — O471 False labor at or after 37 completed weeks of gestation: Secondary | ICD-10-CM | POA: Insufficient documentation

## 2023-10-19 LAB — URINALYSIS, ROUTINE W REFLEX MICROSCOPIC
Bilirubin Urine: NEGATIVE
Glucose, UA: NEGATIVE mg/dL
Ketones, ur: NEGATIVE mg/dL
Nitrite: POSITIVE — AB
Protein, ur: NEGATIVE mg/dL
Specific Gravity, Urine: 1.021 (ref 1.005–1.030)
WBC, UA: >50 WBC/hpf (ref 0–5)
pH: 6 (ref 5.0–8.0)

## 2023-10-19 LAB — WET PREP, GENITAL
Clue Cells Wet Prep HPF POC: NONE SEEN
Sperm: NONE SEEN
Trich, Wet Prep: NONE SEEN
WBC, Wet Prep HPF POC: >=10 — AB (ref <10)
Yeast Wet Prep HPF POC: NONE SEEN

## 2023-10-19 MED ORDER — NITROFURANTOIN MONOHYD MACRO 100 MG PO CAPS: 100.0000 mg | ORAL_CAPSULE | Freq: Two times a day (BID) | ORAL | 0 refills | Status: AC

## 2023-10-19 MED ORDER — NITROFURANTOIN MONOHYD MACRO 100 MG PO CAPS
ORAL_CAPSULE | ORAL | Status: AC
  Filled 2023-10-19: qty 1

## 2023-10-19 MED ORDER — LACTATED RINGERS IV BOLUS
1000.0000 mL | Freq: Once | INTRAVENOUS | Status: AC
  Administered 2023-10-19: 1000 mL via INTRAVENOUS

## 2023-10-19 MED ORDER — ACETAMINOPHEN 500 MG PO TABS
1000.0000 mg | ORAL_TABLET | Freq: Once | ORAL | Status: AC
  Administered 2023-10-19: 1000 mg via ORAL
  Filled 2023-10-19: qty 2

## 2023-10-19 NOTE — Discharge Instructions (Addendum)
[facility]  [address] [place] [address]   Please call [phone] to schedule an appoint with [facility] physician to schedule repeat cesarean birth.

## 2023-10-19 NOTE — OB Triage Note (Signed)
Pt is G2P1 at 37.4 weeks with c/o UCs x 24 hrs. Pt was late to care to [facility]. States she went in November. States she is cx every 4-8 min x 24hrs. Pt denies complications doeshas a marked small abd and a hx of C/S fpr breech about 20 mos ago. Room with very tron smell MJ. Pt denies drug, alcohol or tobacco use. FHR 125
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
## 2023-10-20 ENCOUNTER — Encounter: Payer: Self-pay | Admitting: Obstetrics and Gynecology

## 2023-10-20 ENCOUNTER — Encounter
Admission: EM | Disposition: A | Discharge status: Home / Self Care | Payer: Self-pay | Source: Home / Self Care | Attending: Certified Nurse Midwife

## 2023-10-20 ENCOUNTER — Other Ambulatory Visit: Payer: MEDICAID

## 2023-10-20 ENCOUNTER — Observation Stay: Payer: MEDICAID

## 2023-10-20 ENCOUNTER — Other Ambulatory Visit: Payer: Self-pay

## 2023-10-20 ENCOUNTER — Inpatient Hospital Stay: Payer: MEDICAID | Admitting: Certified Registered"

## 2023-10-20 ENCOUNTER — Inpatient Hospital Stay
Admission: EM | Admit: 2023-10-20 | Discharge: 2023-10-22 | DRG: 787 | Disposition: A | Discharge status: Home / Self Care | Payer: MEDICAID

## 2023-10-20 DIAGNOSIS — O34211 Maternal care for low transverse scar from previous cesarean delivery: Secondary | ICD-10-CM | POA: Diagnosis present

## 2023-10-20 DIAGNOSIS — Z823 Family history of stroke: Secondary | ICD-10-CM

## 2023-10-20 DIAGNOSIS — L910 Hypertrophic scar: Secondary | ICD-10-CM | POA: Diagnosis present

## 2023-10-20 DIAGNOSIS — O99824 Streptococcus B carrier state complicating childbirth: Secondary | ICD-10-CM | POA: Diagnosis present

## 2023-10-20 DIAGNOSIS — Z833 Family history of diabetes mellitus: Secondary | ICD-10-CM | POA: Diagnosis not present

## 2023-10-20 DIAGNOSIS — O9972 Diseases of the skin and subcutaneous tissue complicating childbirth: Secondary | ICD-10-CM | POA: Diagnosis present

## 2023-10-20 DIAGNOSIS — B951 Streptococcus, group B, as the cause of diseases classified elsewhere: Secondary | ICD-10-CM | POA: Insufficient documentation

## 2023-10-20 DIAGNOSIS — O9962 Diseases of the digestive system complicating childbirth: Secondary | ICD-10-CM | POA: Diagnosis present

## 2023-10-20 DIAGNOSIS — O9081 Anemia of the puerperium: Secondary | ICD-10-CM | POA: Diagnosis not present

## 2023-10-20 DIAGNOSIS — Z98891 History of uterine scar from previous surgery: Secondary | ICD-10-CM

## 2023-10-20 DIAGNOSIS — Z8659 Personal history of other mental and behavioral disorders: Secondary | ICD-10-CM

## 2023-10-20 DIAGNOSIS — O479 False labor, unspecified: Principal | ICD-10-CM | POA: Diagnosis present

## 2023-10-20 DIAGNOSIS — D62 Acute posthemorrhagic anemia: Secondary | ICD-10-CM | POA: Diagnosis not present

## 2023-10-20 DIAGNOSIS — Z3A37 37 weeks gestation of pregnancy: Secondary | ICD-10-CM | POA: Diagnosis not present

## 2023-10-20 DIAGNOSIS — O99324 Drug use complicating childbirth: Secondary | ICD-10-CM | POA: Diagnosis present

## 2023-10-20 DIAGNOSIS — K219 Gastro-esophageal reflux disease without esophagitis: Secondary | ICD-10-CM | POA: Diagnosis present

## 2023-10-20 DIAGNOSIS — O0930 Supervision of pregnancy with insufficient antenatal care, unspecified trimester: Secondary | ICD-10-CM

## 2023-10-20 DIAGNOSIS — F1290 Cannabis use, unspecified, uncomplicated: Secondary | ICD-10-CM | POA: Diagnosis present

## 2023-10-20 DIAGNOSIS — O99320 Drug use complicating pregnancy, unspecified trimester: Secondary | ICD-10-CM | POA: Diagnosis present

## 2023-10-20 DIAGNOSIS — O99019 Anemia complicating pregnancy, unspecified trimester: Secondary | ICD-10-CM | POA: Diagnosis present

## 2023-10-20 DIAGNOSIS — O26893 Other specified pregnancy related conditions, third trimester: Secondary | ICD-10-CM | POA: Diagnosis present

## 2023-10-20 LAB — CBC WITH DIFFERENTIAL/PLATELET
Abs Immature Granulocytes: 0.21 10*3/uL — ABNORMAL HIGH (ref 0.00–0.07)
Basophils Absolute: 0.1 10*3/uL (ref 0.0–0.1)
Basophils Relative: 0 %
Eosinophils Absolute: 0 10*3/uL (ref 0.0–0.5)
Eosinophils Relative: 0 %
HCT: 35.7 % — ABNORMAL LOW (ref 36.0–46.0)
Hemoglobin: 11.6 g/dL — ABNORMAL LOW (ref 12.0–15.0)
Immature Granulocytes: 1 %
Lymphocytes Relative: 9 %
Lymphs Abs: 1.4 10*3/uL (ref 0.7–4.0)
MCH: 28.9 pg (ref 26.0–34.0)
MCHC: 32.5 g/dL (ref 30.0–36.0)
MCV: 89 fL (ref 80.0–100.0)
Monocytes Absolute: 0.5 10*3/uL (ref 0.1–1.0)
Monocytes Relative: 4 %
Neutro Abs: 12.8 10*3/uL — ABNORMAL HIGH (ref 1.7–7.7)
Neutrophils Relative %: 86 %
Platelets: 471 10*3/uL — ABNORMAL HIGH (ref 150–400)
RBC: 4.01 MIL/uL (ref 3.87–5.11)
RDW: 14.2 % (ref 11.5–15.5)
WBC: 15 10*3/uL — ABNORMAL HIGH (ref 4.0–10.5)
nRBC: 0 % (ref 0.0–0.2)

## 2023-10-20 LAB — URINE DRUG SCREEN, QUALITATIVE (ARMC ONLY)
Amphetamines, Ur Screen: NOT DETECTED
Barbiturates, Ur Screen: NOT DETECTED
Benzodiazepine, Ur Scrn: NOT DETECTED
Cannabinoid 50 Ng, Ur ~~LOC~~: POSITIVE — AB
Cocaine Metabolite,Ur ~~LOC~~: NOT DETECTED
MDMA (Ecstasy)Ur Screen: NOT DETECTED
Methadone Scn, Ur: NOT DETECTED
Opiate, Ur Screen: NOT DETECTED
Phencyclidine (PCP) Ur S: NOT DETECTED
Tricyclic, Ur Screen: NOT DETECTED

## 2023-10-20 LAB — BASIC METABOLIC PANEL
Anion gap: 10 (ref 5–15)
BUN: 6 mg/dL (ref 6–20)
CO2: 22 mmol/L (ref 22–32)
Calcium: 8.3 mg/dL — ABNORMAL LOW (ref 8.9–10.3)
Chloride: 102 mmol/L (ref 98–111)
Creatinine, Ser: 0.52 mg/dL (ref 0.44–1.00)
GFR, Estimated: >60 mL/min (ref >60)
Glucose, Bld: 145 mg/dL — ABNORMAL HIGH (ref 70–99)
Potassium: 3.2 mmol/L — ABNORMAL LOW (ref 3.5–5.1)
Sodium: 134 mmol/L — ABNORMAL LOW (ref 135–145)

## 2023-10-20 LAB — RPR: RPR Ser Ql: NONREACTIVE

## 2023-10-20 LAB — RAPID HIV SCREEN (HIV 1/2 AB+AG)
HIV 1/2 Antibodies: NONREACTIVE
HIV-1 P24 Antigen - HIV24: NONREACTIVE

## 2023-10-20 SURGERY — Surgical Case
Anesthesia: Spinal

## 2023-10-20 MED ORDER — ONDANSETRON HCL 4 MG/2ML IJ SOLN
INTRAMUSCULAR | Status: DC | PRN
  Administered 2023-10-20: 4 mg via INTRAVENOUS

## 2023-10-20 MED ORDER — PHENYLEPHRINE HCL-NACL 20-0.9 MG/250ML-% IV SOLN
INTRAVENOUS | Status: DC | PRN
  Administered 2023-10-20: 30 ug/min via INTRAVENOUS

## 2023-10-20 MED ORDER — TETANUS-DIPHTH-ACELL PERTUSSIS 5-2.5-18.5 LF-MCG/0.5 IM SUSY: 0.5000 mL | PREFILLED_SYRINGE | Freq: Once | INTRAMUSCULAR | Status: DC

## 2023-10-20 MED ORDER — CEFAZOLIN SODIUM-DEXTROSE 2-4 GM/100ML-% IV SOLN
INTRAVENOUS | Status: AC
  Filled 2023-10-20: qty 100

## 2023-10-20 MED ORDER — ACETAMINOPHEN 325 MG PO TABS
650.0000 mg | ORAL_TABLET | Freq: Four times a day (QID) | ORAL | Status: DC
  Administered 2023-10-20 – 2023-10-22 (×8): 650 mg via ORAL
  Filled 2023-10-20 (×8): qty 2

## 2023-10-20 MED ORDER — COCONUT OIL OIL
1.0000 | TOPICAL_OIL | Status: DC | PRN
  Filled 2023-10-20: qty 15

## 2023-10-20 MED ORDER — BUPIVACAINE HCL (PF) 0.5 % IJ SOLN
INTRAMUSCULAR | Status: AC
  Filled 2023-10-20: qty 10

## 2023-10-20 MED ORDER — FENTANYL CITRATE (PF) 100 MCG/2ML IJ SOLN
INTRAMUSCULAR | Status: DC | PRN
  Administered 2023-10-20: 35 ug via INTRAVENOUS
  Administered 2023-10-20: 50 ug via INTRAVENOUS

## 2023-10-20 MED ORDER — MORPHINE SULFATE (PF) 0.5 MG/ML IJ SOLN: INTRAMUSCULAR | Status: DC | PRN

## 2023-10-20 MED ORDER — KETOROLAC TROMETHAMINE 30 MG/ML IJ SOLN
INTRAMUSCULAR | Status: DC | PRN
  Administered 2023-10-20: 30 mg via INTRAVENOUS

## 2023-10-20 MED ORDER — MORPHINE SULFATE (PF) 0.5 MG/ML IJ SOLN
INTRAMUSCULAR | Status: DC | PRN
  Administered 2023-10-20: .1 ug via INTRATHECAL

## 2023-10-20 MED ORDER — SODIUM CHLORIDE 0.9% FLUSH
INTRAVENOUS | Status: DC | PRN
  Administered 2023-10-20: 20 mL via INTRAVENOUS

## 2023-10-20 MED ORDER — KETOROLAC TROMETHAMINE 30 MG/ML IJ SOLN: 30.0000 mg | Freq: Four times a day (QID) | INTRAMUSCULAR | Status: AC | PRN

## 2023-10-20 MED ORDER — SIMETHICONE 80 MG PO CHEW
80.0000 mg | CHEWABLE_TABLET | Freq: Three times a day (TID) | ORAL | Status: DC
  Administered 2023-10-20 – 2023-10-22 (×6): 80 mg via ORAL
  Filled 2023-10-20 (×5): qty 1

## 2023-10-20 MED ORDER — OXYTOCIN-SODIUM CHLORIDE 30-0.9 UT/500ML-% IV SOLN
INTRAVENOUS | Status: AC
  Filled 2023-10-20: qty 500

## 2023-10-20 MED ORDER — CEFAZOLIN SODIUM-DEXTROSE 2-4 GM/100ML-% IV SOLN
2.0000 g | INTRAVENOUS | Status: AC
Start: 2023-10-20 — End: 2023-10-20
  Administered 2023-10-20: 2 g via INTRAVENOUS

## 2023-10-20 MED ORDER — FENTANYL CITRATE (PF) 100 MCG/2ML IJ SOLN
50.0000 ug | Freq: Once | INTRAMUSCULAR | Status: AC
  Administered 2023-10-20: 50 ug via INTRAVENOUS
  Filled 2023-10-20: qty 2

## 2023-10-20 MED ORDER — IBUPROFEN 600 MG PO TABS
600.0000 mg | ORAL_TABLET | Freq: Four times a day (QID) | ORAL | Status: DC
  Administered 2023-10-20 – 2023-10-22 (×7): 600 mg via ORAL
  Filled 2023-10-20 (×7): qty 1

## 2023-10-20 MED ORDER — DIPHENHYDRAMINE HCL 50 MG/ML IJ SOLN
12.5000 mg | INTRAMUSCULAR | Status: DC | PRN
  Administered 2023-10-20: 12.5 mg via INTRAVENOUS
  Filled 2023-10-20: qty 1

## 2023-10-20 MED ORDER — NALOXONE HCL 0.4 MG/ML IJ SOLN: 0.4000 mg | INTRAMUSCULAR | Status: DC | PRN

## 2023-10-20 MED ORDER — DIPHENHYDRAMINE HCL 25 MG PO CAPS
25.0000 mg | ORAL_CAPSULE | ORAL | Status: DC | PRN
  Administered 2023-10-20: 25 mg via ORAL
  Filled 2023-10-20: qty 1

## 2023-10-20 MED ORDER — POLYETHYLENE GLYCOL 3350 17 G PO PACK: 17.0000 g | PACK | Freq: Every day | ORAL | Status: DC

## 2023-10-20 MED ORDER — SODIUM CHLORIDE 0.9% FLUSH: 3.0000 mL | INTRAVENOUS | Status: DC | PRN

## 2023-10-20 MED ORDER — FENTANYL CITRATE (PF) 100 MCG/2ML IJ SOLN
INTRAMUSCULAR | Status: DC | PRN
  Administered 2023-10-20: 15 ug via INTRATHECAL

## 2023-10-20 MED ORDER — OXYTOCIN-SODIUM CHLORIDE 30-0.9 UT/500ML-% IV SOLN
2.5000 [IU]/h | INTRAVENOUS | Status: AC
Start: 2023-10-20 — End: 2023-10-21
  Administered 2023-10-20: 2.5 [IU]/h via INTRAVENOUS
  Filled 2023-10-20: qty 500

## 2023-10-20 MED ORDER — BUPIVACAINE IN DEXTROSE 0.75-8.25 % IT SOLN
INTRATHECAL | Status: DC | PRN
  Administered 2023-10-20: 1.4 mL via INTRATHECAL

## 2023-10-20 MED ORDER — SOD CITRATE-CITRIC ACID 500-334 MG/5ML PO SOLN
ORAL | Status: AC
  Administered 2023-10-20: 30 mL via ORAL
  Filled 2023-10-20: qty 15

## 2023-10-20 MED ORDER — SIMETHICONE 80 MG PO CHEW: 80.0000 mg | CHEWABLE_TABLET | ORAL | Status: DC | PRN

## 2023-10-20 MED ORDER — KETOROLAC TROMETHAMINE 30 MG/ML IJ SOLN
30.0000 mg | Freq: Four times a day (QID) | INTRAMUSCULAR | Status: AC | PRN
  Administered 2023-10-20: 30 mg via INTRAVENOUS
  Filled 2023-10-20: qty 1

## 2023-10-20 MED ORDER — DEXAMETHASONE SODIUM PHOSPHATE 10 MG/ML IJ SOLN
INTRAMUSCULAR | Status: DC | PRN
  Administered 2023-10-20: 8 mg via INTRAVENOUS

## 2023-10-20 MED ORDER — OXYCODONE HCL 5 MG PO TABS: 5.0000 mg | ORAL_TABLET | Freq: Four times a day (QID) | ORAL | Status: DC | PRN

## 2023-10-20 MED ORDER — DIPHENHYDRAMINE HCL 25 MG PO CAPS: 25.0000 mg | ORAL_CAPSULE | Freq: Four times a day (QID) | ORAL | Status: DC | PRN

## 2023-10-20 MED ORDER — SODIUM CHLORIDE 0.9 % IV SOLN: 12.5000 mg | INTRAVENOUS | Status: DC | PRN

## 2023-10-20 MED ORDER — NALOXONE HCL 4 MG/10ML IJ SOLN: 1.0000 ug/kg/h | INTRAVENOUS | Status: DC | PRN

## 2023-10-20 MED ORDER — MORPHINE SULFATE (PF) 0.5 MG/ML IJ SOLN
INTRAMUSCULAR | Status: AC
  Filled 2023-10-20: qty 10

## 2023-10-20 MED ORDER — PHENYLEPHRINE HCL-NACL 20-0.9 MG/250ML-% IV SOLN
INTRAVENOUS | Status: AC
Start: 2023-10-20 — End: ?
  Filled 2023-10-20: qty 250

## 2023-10-20 MED ORDER — LACTATED RINGERS IV SOLN: INTRAVENOUS | Status: DC

## 2023-10-20 MED ORDER — DIBUCAINE (PERIANAL) 1 % EX OINT: 1.0000 | TOPICAL_OINTMENT | CUTANEOUS | Status: DC | PRN

## 2023-10-20 MED ORDER — PRENATAL MULTIVITAMIN CH
1.0000 | ORAL_TABLET | Freq: Every day | ORAL | Status: DC
  Administered 2023-10-20 – 2023-10-22 (×3): 1 via ORAL
  Filled 2023-10-20 (×3): qty 1

## 2023-10-20 MED ORDER — WITCH HAZEL-GLYCERIN EX PADS: 1.0000 | MEDICATED_PAD | CUTANEOUS | Status: DC | PRN

## 2023-10-20 MED ORDER — AZITHROMYCIN 500 MG IV SOLR
500.0000 mg | INTRAVENOUS | Status: AC
  Administered 2023-10-20: 500 mg via INTRAVENOUS
  Filled 2023-10-20: qty 5

## 2023-10-20 MED ORDER — SENNA 8.6 MG PO TABS
1.0000 | ORAL_TABLET | Freq: Every day | ORAL | Status: DC
  Administered 2023-10-21: 8.6 mg via ORAL
  Filled 2023-10-20 (×4): qty 1

## 2023-10-20 MED ORDER — CHLORHEXIDINE GLUCONATE 0.12 % MT SOLN
OROMUCOSAL | Status: AC
  Filled 2023-10-20: qty 15

## 2023-10-20 MED ORDER — MEPERIDINE HCL 25 MG/ML IJ SOLN: 6.2500 mg | INTRAMUSCULAR | Status: DC | PRN

## 2023-10-20 MED ORDER — MEASLES, MUMPS & RUBELLA VAC IJ SOLR
0.5000 mL | Freq: Once | INTRAMUSCULAR | Status: DC
Start: 2023-10-21 — End: 2023-10-22
  Filled 2023-10-20: qty 0.5

## 2023-10-20 MED ORDER — MENTHOL 3 MG MT LOZG: 1.0000 | LOZENGE | OROMUCOSAL | Status: DC | PRN

## 2023-10-20 MED ORDER — FENTANYL CITRATE (PF) 100 MCG/2ML IJ SOLN
INTRAMUSCULAR | Status: AC
  Filled 2023-10-20: qty 2

## 2023-10-20 MED ORDER — SOD CITRATE-CITRIC ACID 500-334 MG/5ML PO SOLN
30.0000 mL | ORAL | Status: AC
Start: 2023-10-20 — End: 2023-10-20

## 2023-10-20 MED ORDER — FERROUS SULFATE 325 (65 FE) MG PO TABS
325.0000 mg | ORAL_TABLET | ORAL | Status: DC
  Administered 2023-10-20: 325 mg via ORAL
  Filled 2023-10-20: qty 1

## 2023-10-20 MED ORDER — OXYTOCIN-SODIUM CHLORIDE 30-0.9 UT/500ML-% IV SOLN
INTRAVENOUS | Status: DC | PRN
  Administered 2023-10-20: 250 mL/h via INTRAVENOUS

## 2023-10-20 MED ORDER — BUPIVACAINE HCL (PF) 0.5 % IJ SOLN
INTRAMUSCULAR | Status: DC | PRN
  Administered 2023-10-20: 40 mL

## 2023-10-20 MED ORDER — OXYCODONE HCL 5 MG PO TABS
5.0000 mg | ORAL_TABLET | ORAL | Status: DC | PRN
  Administered 2023-10-21: 5 mg via ORAL
  Filled 2023-10-20: qty 1

## 2023-10-20 MED ORDER — FENTANYL CITRATE (PF) 100 MCG/2ML IJ SOLN: 25.0000 ug | INTRAMUSCULAR | Status: DC | PRN

## 2023-10-20 MED ORDER — ONDANSETRON HCL 4 MG/2ML IJ SOLN: 4.0000 mg | Freq: Three times a day (TID) | INTRAMUSCULAR | Status: DC | PRN

## 2023-10-20 MED ORDER — BUPIVACAINE HCL (PF) 0.5 % IJ SOLN
INTRAMUSCULAR | Status: AC
  Filled 2023-10-20: qty 30

## 2023-10-20 MED ORDER — EPHEDRINE SULFATE-NACL 50-0.9 MG/10ML-% IV SOSY
PREFILLED_SYRINGE | INTRAVENOUS | Status: DC | PRN
  Administered 2023-10-20: 5 mg via INTRAVENOUS
  Administered 2023-10-20: 10 mg via INTRAVENOUS

## 2023-10-20 SURGICAL SUPPLY — 7 items
DRAPE C-SECTION (MISCELLANEOUS) IMPLANT
DRSG TELFA 3X8 NADH (GAUZE/BANDAGES/DRESSINGS) ×1 IMPLANT
GAUZE SPONGE 4X4 12PLY STRL (GAUZE/BANDAGES/DRESSINGS) IMPLANT
MAT PREVALON FULL STRYKER (MISCELLANEOUS) IMPLANT
PACK C SECTION AR (MISCELLANEOUS) IMPLANT
PAD DRESSING TELFA 3X8 NADH (GAUZE/BANDAGES/DRESSINGS) IMPLANT
TAPE PAPER 3X10 WHT MICROPORE (GAUZE/BANDAGES/DRESSINGS) IMPLANT

## 2023-10-20 NOTE — Op Note (Addendum)
 Cesarean Section Operative Note   Name: Pamela Friedman. [facility] MRN: [id number] Date of surgery: 10/20/2023   Pre-operative diagnosis: Intrauterine pregnancy at [redacted]w[redacted]d Labor History of cesarean section THC use in pregnancy UTI during pregnancy Depression Limited prenatal care Anemia  Post-operative diagnosis: Same s/p repeat low transverse cesarean section   Procedure: Repeat low-transverse cesarean section, excision of keloid scar   Surgeon: Beverli Dinsmore, MD  Assistant: Jazmine Laboon, CNM. No other capable assistant available, in surgery requiring high level assistant.   Anesthesia: Spinal  Indication: Patient presented in labor (cervical change from closed to 2 to 4 cm) with a history of cesarean section and desired a repeat c/s.    Blood loss:  400cc Fluids: 1400cc UOP: 100cc   Complications: none Specimens: None   Findings: Thick hypertrophic keloid scar which was excised. Minimal fascial adhesions. Rectus muscles scarred together but other no intra-abdominal adhesions. Normal uterus, fallopian tubes, bilateral ovaries.    Procedure: The patient was evaluated and consented prior to surgery. The risks, benefits, complications, treatment options, and expected outcomes were discussed with the patient. The patient concurred with the proposed plan, giving informed consent. The site of surgery was properly noted.    The patient was taken to Operating Room [facility] OR 02, identified as Pamela Friedman and the procedure verified as a C-Section Delivery.  A Time Out was held and the above information confirmed. Antibiotics were given: See MAR. Sequential compression devices were placed.    After induction of anesthesia, the patient was placed in a dorsal supine position with a left lateral tilt. A foley catheter was placed, and the patient was prepped and draped in the usual sterile manner. After confirming an adequate level of anesthesia, a pfannenstiel skin incision was made  with a scalpel. The keloid scar was excised using the scalpel and Bovie. The scalpel was then used to carry down the incision through the subcutaneous tissue to the fascia. A fascial incision was made and extended transversely. The fascia was dissected off the rectus muscles superiorly and inferiorly. The peritoneum was identified and entered superiorly with blunt dissection.  The peritoneal incision was extended superiorly and inferiorly with good visualization of the bladder. The utero-vesical peritoneal reflection was incised transversely and the bladder flap was bluntly freed from the lower uterine segment. A bladder blade was placed behind the bladder flap. A low transverse uterine incision was made and bag of water was broken (clear). The infant was delivered atraumatically without difficulty. The umbilical cord was clamped and cut after 60 seconds, and the infant was then handed to the neonatal team. The placenta was removed with fundal massage and manual extraction. It was noted to be intact; disposed. Given O+ blood type, cord blood was collected. The uterine outline, tubes, and ovaries appeared normal. The uterus was exteriorized and cleared of all clots and debris. The uterine incision was closed with running locked zero monocryl suture. Hemostasis was observed. The uterus was replaced in the abdomen. The cul-de-sac and peritoneal cavity were irrigated and cleared of all clots and debris. Again hemostasis was noted. The fascia was then reapproximated with running sutures of #1PDS. Irrigation of the subcutaneous tissue was done with normal saline and hemostasis was obtained. 60cc of 0.5% bupivacaine  was injected into the fascia, subcutaneous tissue, and skin for local anesthesia.  Subcutaneous tissue was reapproximated in a running fashion using 3-0 Monocryl suture. Finally, the skin was reapproximated with 4-0 monocryl.  Telfa was then placed with pressure dressing. Patient was  left in stable condition  and taken to [facility] PACU.   The instrument, sponge, and needle counts were correct prior to the abdominal closure and at the conclusion of the case.     Information for the patient's newborn:  Kajol, Crispen [[id number]]  Live born female  Birth Weight: 5 lb 14.9 oz (2690 g) APGAR: 8, 9  Newborn Delivery   Birth date/time: 10/20/2023 06:55:00 Delivery type: C-Section, Low Transverse Trial of labor: No C-section categorization: Repeat    Maternal condition: stable, to postpartum Newborn condition: stable, remains with mother
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
## 2023-10-20 NOTE — Anesthesia Preprocedure Evaluation (Signed)
Anesthesia Evaluation  Patient identified by MRN, date of birth, ID band Patient awake    Reviewed: Allergy & Precautions, NPO status , Patient's Chart, lab work & pertinent test results  History of Anesthesia Complications Negative for: history of anesthetic complications  Airway Mallampati: II  TM Distance: >3 FB Neck ROM: Full    Dental  (+) Teeth Intact, Dental Advidsory Given   Pulmonary neg pulmonary ROS   Pulmonary exam normal breath sounds clear to auscultation       Cardiovascular Exercise Tolerance: Good negative cardio ROS Normal cardiovascular exam Rhythm:Regular     Neuro/Psych negative neurological ROS     GI/Hepatic Neg liver ROS,GERD  ,,  Endo/Other  negative endocrine ROS    Renal/GU negative Renal ROS  negative genitourinary   Musculoskeletal   Abdominal Normal abdominal exam  (+)   Peds negative pediatric ROS (+)  Hematology negative hematology ROS (+) Blood dyscrasia, anemia   Anesthesia Other Findings Past Medical History: No date: Anemia No date: Mental disorder     Comment:  hx depression, suicidal thoughts  Past Surgical History: No date: denies No date: NO PAST SURGERIES  BMI    Body Mass Index: 25.69 kg/m      Reproductive/Obstetrics (+) Pregnancy                             Anesthesia Physical Anesthesia Plan  ASA: 2  Anesthesia Plan: Spinal   Post-op Pain Management:    Induction:   PONV Risk Score and Plan: 2  Airway Management Planned: Natural Airway  Additional Equipment:   Intra-op Plan:   Post-operative Plan:   Informed Consent: I have reviewed the patients History and Physical, chart, labs and discussed the procedure including the risks, benefits and alternatives for the proposed anesthesia with the patient or authorized representative who has indicated his/her understanding and acceptance.     Dental Advisory Given  Plan  Discussed with: CRNA and Surgeon  Anesthesia Plan Comments:         Anesthesia Quick Evaluation

## 2023-10-20 NOTE — Transfer of Care (Signed)
Immediate Anesthesia Transfer of Care Note  Patient: Pamela Friedman  Procedure(s) Performed: CESAREAN SECTION  Patient Location: PACU  Anesthesia Type:General  Level of Consciousness: awake, alert , and oriented  Airway & Oxygen Therapy: Patient Spontanous Breathing  Post-op Assessment: Report given to RN and Post -op Vital signs reviewed and stable  Post vital signs: Reviewed and stable  Last Vitals:  Vitals Value Taken Time  BP 126/72 10/20/23 0746  Temp    Pulse 78 10/20/23 0746  Resp 20 10/20/23 0746  SpO2 99 % 10/20/23 0746    Last Pain: There were no vitals filed for this visit.       Complications: No notable events documented.
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
## 2023-10-20 NOTE — OB Triage Note (Signed)
Pt returns with increased UCs and pain 10/10. CNM called to assess

## 2023-10-20 NOTE — Lactation Note (Signed)
This note was copied from a baby's chart. Lactation Consultation Note  Patient Name: Pamela Friedman [id number] Date: 10/20/2023 Age:22 hours Reason for consult: Follow-up assessment;Mother's request;Early term 37-38.6wks;Breastfeeding assistance   Maternal Data Lactation called to room to assist patient w/ a feeding at the breast.  RN/patient verbalized to [facility] that infant did a big spit up.  Mom is concerned that she doesn't have any milk.   Feeding Mother's Current Feeding Choice: Breast Milk Nipple Type: Slow - flow  Patient attempted to put infant to the breast but infant was not interested in a feeding.  Lactation Tools Discussed/Used Tools: Pump Breast pump type: Double-Electric Breast Pump Pump Education: Setup, frequency, and cleaning Reason for Pumping: Patient requested a DEBP be set up for extra stimulation of the breast.  Patient requested that a DEBP be set up in her room so she can start pumping.  LC did not sit for a pump session because patient was holding infant.  Education was provided on how to use the pump and LC sized mom at a 24mm flange.   Patient has very little breast tissue.   Interventions Interventions: Breast feeding basics reviewed;Education  Education on the 1st 24hr of life for infant.  Assisted patient with hand expression.    Consult Status Consult Status: Follow-up Date: 10/21/23 Follow-up type: In-patient    Pamela Friedman [name] 10/20/2023, 5:29 PM

## 2023-10-20 NOTE — Discharge Summary (Shared)
Postpartum Discharge Summary  Patient Name: Pamela Friedman. Jeffrey DOB: 01-31-2001 MRN: [id number]  Date of admission: 10/20/2023 Delivery date:10/20/2023 Delivering provider: Kathalene Frames V Date of discharge: 10/22/2023  Primary OB: [facility] [id number] last menstrual period was 01/31/2023 (within days). EDC Estimated Date of Delivery: 11/07/23 Gestational Age at Delivery: [redacted]w[redacted]d   Admitting diagnosis: Uterine contractions [O47.9] Intrauterine pregnancy: [redacted]w[redacted]d     Secondary diagnosis:   Principal Problem:   Uterine contractions Active Problems:   History of depression   Late prenatal care   History of cesarean delivery   Anemia affecting pregnancy, antepartum   Marijuana use during pregnancy   Positive GBS test   Discharge Diagnosis: Term Pregnancy Delivered                                                Post partum procedures:blood transfusion Augmentation:: N/A Complications: None Delivery Type: repeat cesarean section, low transverse incision Anesthesia: spinal anesthesia Placenta: spontaneous To Pathology: No   Prenatal Labs:  Blood type/Rh O POS Performed at [facility], [address] [place], [place] [address]    Antibody screen Negative    Rubella Immune (11/26 0000)   Varicella Immune  RPR Nonreactive (11/26 0000)   HBsAg Negative (11/26 0000)  Hep C Non Reactive   HIV Non-reactive (11/26 0000)   GC Negative  Chlamydia Negative  Genetic screening cfDNA negative   1 hour GTT Not completed   3 hour GTT Not completed   GBS Positive/-- (11/26 1552)      Hospital course:  Membrane Rupture Time/Date: 6:53 AM,10/20/2023  Delivery Method:C-Section, Low Transverse Operative Delivery:N/A Details of operation can be found in separate operative note.  Patient had an uncomplicated postpartum course.  She is ambulating, tolerating a regular diet, passing flatus, and urinating well. EPDS prior to discharge was elevated. Reviewed resources for  depression to include counseling and/or medications. Pamela Friedman declined counseling and was unsure about medications. She will let us know if she desires to start medication. Plan for mood check 1 week postpartum. Patient is discharged home in stable condition on  10/22/23        Newborn Data: Birth date:10/20/2023 Birth time:6:55 AM Gender:Female Living status:Living Apgars:8 ,9  Weight:2690 g    Pamela Friedman is a 22 yo G2P1001 at [redacted]w[redacted]d with a hx of c/s x 1 who presented in labor and was admitted for a repeat cesarean section. On 10/20/23 at 0655, she delivered a viable baby boy via repeat low transverse cesarean section.   She is discharged home on Miralax daily x30d and senna 2 tabs daily x 7 days.   Magnesium Sulfate received: No MMR:N/A Varivax vaccine given: offered prior to discharge  T-DaP: Received 10/14/23 Flu: No RSV: declined   Transfusion:Yes - 1 unit pRBC and IV iron   Physical exam  Vitals:   10/21/23 2035 10/22/23 0000 10/22/23 0812 10/22/23 1156  BP: 138/67 116/68 120/85   Pulse: 82 66 88   Resp: 20 20 20    Temp: 98.4 F (36.9 C) 98.5 F (36.9 C) 98.5 F (36.9 C) 98.8 F (37.1 C)  TempSrc: Oral Oral Oral Oral  SpO2: 100% 100% 100%   Weight:      Height:       General: alert, cooperative, and no distress Lochia: appropriate Uterine Fundus: firm Perineum:minimal edema/intact Incision: Healing well with no significant  drainage, Dressing is clean, dry, and intact, covered with occlusive OP site dressing  DVT Evaluation: No evidence of DVT seen on physical exam.  Labs: Lab Results  Component Value Date   WBC 12.2 (H) 10/22/2023   HGB 9.7 (L) 10/22/2023   HCT 29.0 (L) 10/22/2023   MCV 87.9 10/22/2023   PLT 431 (H) 10/22/2023      Latest Ref Rng & Units 10/20/2023    9:11 AM  CMP  Glucose 70 - 99 mg/dL 213   BUN 6 - 20 mg/dL 6   Creatinine 0.86 - 5.78 mg/dL 4.69   Sodium 629 - 528 mmol/L 134   Potassium 3.5 - 5.1 mmol/L 3.2   Chloride 98 - 111  mmol/L 102   CO2 22 - 32 mmol/L 22   Calcium 8.9 - 10.3 mg/dL 8.3    Edinburgh Score:    10/22/2023   12:03 PM  Edinburgh Postnatal Depression Scale Screening Tool  I have been able to laugh and see the funny side of things. 2  I have looked forward with enjoyment to things. 2  I have blamed myself unnecessarily when things went wrong. 2  I have been anxious or worried for no good reason. 2  I have felt scared or panicky for no good reason. 1  Things have been getting on top of me. 2  I have been so unhappy that I have had difficulty sleeping. 2  I have felt sad or miserable. 2  I have been so unhappy that I have been crying. 1  The thought of harming myself has occurred to me. 0  Edinburgh Postnatal Depression Scale Total 16    Risk assessment for postpartum VTE and prophylactic treatment: Very high risk factors: None High risk factors: If 1 risk factor, mechanical prophylaxis and early ambulation  and Unscheduled cesarean after labor  Moderate risk factors: None  Postpartum VTE prophylaxis with LMWH not indicated  After visit meds:  Allergies as of 10/22/2023   No Known Allergies      Medication List     STOP taking these medications    clotrimazole 1 % vaginal cream Commonly known as: Clotrimazole-7       TAKE these medications    acetaminophen 325 MG tablet Commonly known as: TYLENOL Take 2 tablets (650 mg total) by mouth every 6 (six) hours as needed for mild pain (pain score 1-3) or moderate pain (pain score 4-6).   ibuprofen 600 MG tablet Commonly known as: ADVIL Take 1 tablet (600 mg total) by mouth every 6 (six) hours as needed for mild pain (pain score 1-3), moderate pain (pain score 4-6) or cramping.   Iron (Ferrous Sulfate) 325 (65 Fe) MG Tabs Take 1 tablet by mouth daily at 6 (six) AM.   nitrofurantoin (macrocrystal-monohydrate) 100 MG capsule Commonly known as: Macrobid Take 1 capsule (100 mg total) by mouth 2 (two) times daily for 5 days.    oxyCODONE 5 MG immediate release tablet Commonly known as: Oxy IR/ROXICODONE Take 1 tablet (5 mg total) by mouth every 6 (six) hours as needed for up to 5 days for severe pain (pain score 7-10).   Prenatal Vitamin 27-0.8 MG Tabs Take 1 tablet by mouth daily at 6 (six) AM.   senna 8.6 MG Tabs tablet Commonly known as: SENOKOT Take 1 tablet (8.6 mg total) by mouth daily for 7 days.       Discharge home in stable condition Infant Feeding: Bottle and Breast Infant Disposition:home with mother  Discharge instruction: per After Visit Summary and Postpartum booklet. Activity: Advance as tolerated. Pelvic rest for 6 weeks.  Diet: routine diet Anticipated Birth Control: Unsure Postpartum Appointment: Post-op incision check with Pamela Friedman in 2 weeks and postpartum visit with Pamela Friedman in 6 weeks.  Additional Postpartum F/U: Postpartum Depression checkup and Incision check 1 week Future Appointments: No future appointments.  Follow up Visit:  Follow-up Information     Pamela Friedman, Pamela Holts, MD. Schedule an appointment as soon as possible for a visit in 1 week(s).   Specialty: Obstetrics and Gynecology Why: Call and schedule an appointment for a 1-week incision check with Pamela Friedman at [facility], [place]! Contact information: [address] [place] [address] [phone]                 Plan:  Pamela Friedman. Pamela Friedman was discharged to home in good condition. Follow-up appointment as directed.    Signed:  Margaretmary Friedman, Pamela Friedman Certified Nurse Midwife [facility] OB/GYN [facility]

## 2023-10-20 NOTE — Progress Notes (Signed)
Pt tearful with UCs. Education to complete ABX and get new ABX RX in am and complete that course. Pt and family member verbalize understanding.

## 2023-10-20 NOTE — Anesthesia Procedure Notes (Addendum)
Spinal  Patient location during procedure: OB Start time: 10/20/2023 6:24 AM Reason for block: surgical anesthesia Staffing Performed: resident/CRNA  Performed by: Jaye Beagle, CRNA Authorized by: Corinda Gubler, MD   Preanesthetic Checklist Completed: patient identified, IV checked, site marked, risks and benefits discussed, surgical consent, monitors and equipment checked, pre-op evaluation and timeout performed Spinal Block Patient position: sitting Prep: ChloraPrep Patient monitoring: heart rate, cardiac monitor, continuous pulse ox and blood pressure Approach: midline Location: L3-4 Injection technique: single-shot Needle Needle type: Sprotte  Needle gauge: 24 G Needle length: 9 cm Assessment Sensory level: T4 Events: CSF return Additional Notes Atraumatic attemptX1.  Pt tolerated well.

## 2023-10-21 LAB — CBC
HCT: 22.7 % — ABNORMAL LOW (ref 36.0–46.0)
HCT: 28.2 % — ABNORMAL LOW (ref 36.0–46.0)
Hemoglobin: 7.6 g/dL — ABNORMAL LOW (ref 12.0–15.0)
Hemoglobin: 9.6 g/dL — ABNORMAL LOW (ref 12.0–15.0)
MCH: 29.8 pg (ref 26.0–34.0)
MCH: 29.9 pg (ref 26.0–34.0)
MCHC: 33.5 g/dL (ref 30.0–36.0)
MCHC: 34 g/dL (ref 30.0–36.0)
MCV: 89 fL (ref 80.0–100.0)
MCV: 87.9 fL (ref 80.0–100.0)
Platelets: 357 10*3/uL (ref 150–400)
Platelets: 410 10*3/uL — ABNORMAL HIGH (ref 150–400)
RBC: 2.55 MIL/uL — ABNORMAL LOW (ref 3.87–5.11)
RBC: 3.21 MIL/uL — ABNORMAL LOW (ref 3.87–5.11)
RDW: 14.2 % (ref 11.5–15.5)
RDW: 14.1 % (ref 11.5–15.5)
WBC: 13.1 10*3/uL — ABNORMAL HIGH (ref 4.0–10.5)
WBC: 13 10*3/uL — ABNORMAL HIGH (ref 4.0–10.5)
nRBC: 0 % (ref 0.0–0.2)
nRBC: 0.2 % (ref 0.0–0.2)

## 2023-10-21 LAB — PREPARE RBC (CROSSMATCH)

## 2023-10-21 MED ORDER — DOCUSATE SODIUM 100 MG PO CAPS
100.0000 mg | ORAL_CAPSULE | Freq: Two times a day (BID) | ORAL | Status: DC
  Administered 2023-10-21 – 2023-10-22 (×3): 100 mg via ORAL
  Filled 2023-10-21 (×3): qty 1

## 2023-10-21 MED ORDER — SODIUM CHLORIDE 0.9% IV SOLUTION
Freq: Once | INTRAVENOUS | Status: AC
Start: 2023-10-21 — End: 2023-10-21

## 2023-10-21 MED ORDER — FERROUS SULFATE 325 (65 FE) MG PO TABS
325.0000 mg | ORAL_TABLET | Freq: Two times a day (BID) | ORAL | Status: DC
  Administered 2023-10-21 – 2023-10-22 (×2): 325 mg via ORAL
  Filled 2023-10-21 (×2): qty 1

## 2023-10-21 NOTE — Progress Notes (Signed)
Postop Day  1  Subjective: 22 y.o. [id number] postpartum day #1 status post normal spontaneous vaginal delivery. She is ambulating, is tolerating po, is voiding spontaneously.  Her pain is well controlled on PO pain medications. Her lochia is less than menses.  Objective: BP 120/79 (BP Location: Right Arm)   Pulse 72   Temp 98.4 F (36.9 C) (Tympanic)   Resp 18   Ht 5' 3.5" (1.613 m)   Wt 58 kg   LMP 01/31/2023 (Within Days)   SpO2 100%   Breastfeeding Unknown   BMI 22.30 kg/m    Physical Exam:  General: alert, cooperative, and appears stated age Breasts: soft/nontender Pulm: nl effort Abdomen: soft, non-tender, active bowel sounds Uterine Fundus: firm Incision: no significant drainage, no dehiscence, no significant erythema Perineum: no edema, intact Lochia: appropriate DVT Evaluation: No evidence of DVT seen on physical exam. Negative Homan's sign. No cords or calf tenderness. No significant calf/ankle edema.  Recent Labs    10/20/23 0536 10/21/23 0546  HGB 11.6* 7.6*  HCT 35.7* 22.7*  WBC 15.0* 13.1*  PLT 471* 357    Assessment/Plan: 22 y.o. [id number] postpartum/ postop day # 1  1. Continue routine postpartum care  2. Infant feeding status: breast and formula feeding --desires to breastfeed,  as she breast fed her daughter for  about 4-5 months, but states the baby will not latch and her milk is not coming in.  -patient educated about physiological sequence in breastfeeding. Provided reassurance that this is also a learning process for her baby and he needs more time to learn how to feed.  --Lactation consult for breastfeeding   3. Contraception plan: IUD, Liletta  4. Acute blood loss anemia - clinically significant.  --Hemodynamically stable and symptomatic --Intervention: start on oral supplementation with ferrous sulfate 325 mg , recommend blood transfusion 1 unit of pRBC's, and continue to monitor H&H  5. Immunization status:   all immunizations up to  date  6. Increase iron supplement to BID with meals  7. continue senna daily and start Colace BID   Disposition: continue inpatient postpartum care    LOS: 1 day   ----- Chari Manning Certified Nurse Midwife [facility]
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
## 2023-10-21 NOTE — Discharge Instructions (Signed)
Discharge Instructions:   If there are any new medications, they have been ordered and will be available for pickup at the listed pharmacy on your way home from the hospital.   Call office if you have any of the following: headache, visual changes, fever >101.0 F, chills, shortness of breath, breast concerns, excessive vaginal bleeding, incision drainage or problems, leg pain or redness, depression or any other concerns. If you have vaginal discharge with an odor, let your doctor know.   It is normal to bleed for up to 6 weeks. You should not soak through more than 1 pad in 1 hour. If you have a blood clot larger than your fist with continued bleeding, call your doctor.   After a c-section, you should expect a small amount of blood or clear fluid coming from the incision and abdominal cramping/soreness. Inspect your incision site daily. Stand in front of a mirror to look for any redness, incision opening, or discolored/odorness drainage. Take a shower daily and continue good hygiene. Use own towel and washcloth (do not share). Make sure your sheets on your bed are clean. No pets sleeping around your incision site. Dressing will be removed at your postpartum visit. If the dressing does become wet or soiled underneath, it is okay to remove it.   Activity: Do not lift > 10 lbs for 6 weeks (do not lift anything heavier than your baby). No intercourse, tampons, swimming pools, hot tubs, baths (only showers) for 6 weeks.  No driving for 1-2 weeks. Continue prenatal vitamin, especially if breastfeeding. Increase calories and fluids (water) while breastfeeding.   Your milk will come in, in the next couple of days (right now it is colostrum). You may have a slight fever when your milk comes in, but it should go away on its own.  If it does not, and rises above 101 F please call the doctor. You will also feel achy and your breasts will be firm. They will also start to leak. If you are breastfeeding, continue  as you have been and you can pump/express milk for comfort.   If you have too much milk, your breasts can become engorged, which could lead to mastitis. This is an infection of the milk ducts. It can be very painful and you will need to notify your doctor to obtain a prescription for antibiotics. You can also treat it with a shower or hot/cold compress.   For concerns about your baby, please call your pediatrician.  For breastfeeding concerns, the lactation consultant can be reached at [phone].   Postpartum blues (feelings of happy one minute and sad another minute) are normal for the first few weeks but if it gets worse let your doctor know.   Congratulations! We enjoyed caring for you and your new bundle of joy!  Discharge instructions:   Call office if you have any of the following:  headache, visual changes, fever >101.0 F, chills, breast concerns (engorgement, mastitis) excessive vaginal bleeding, incision drainage or problems, leg pain or redness, depression or any other concerns.   Activity: Do not lift > 10 lbs for 6 weeks.  No intercourse or tampons for 6 weeks.  No driving for 1-2 weeks or while taking pain medication. No strenuous activity or heavy lifting for 6 weeks.  No swimming pools, hot tubs or tub baths- showers only.    It is normal to bleed for up to 6 weeks. You should not soak through more than 1 pad in 1 hour.  Continue prenatal vitamin. Increase calories and fluids while breastfeeding.  Your milk will come in, in the next couple of days (right now it is colostrum).  You may have a slight fever when your milk comes in, but it should go away on its own.   If it does not, and rises above 101 F please call the doctor.  You will also feel achy and your breasts will be firm. They will also start to leak.  If you are breastfeeding, continue as you have been and you can pump/express milk for comfort.   For concerns about your baby, please call your  pediatrician For breastfeeding concerns, the lactation consultant can be reached at [phone]  Postpartum blues (feelings of happy one minute and sad another minute) are normal for the first few weeks but if it gets worse let your doctor know.

## 2023-10-21 NOTE — Anesthesia Post-op Follow-up Note (Signed)
  Anesthesia Pain Follow-up Note  Patient: Pamela Friedman. Fleischhacker  Day #: 1  Date of Follow-up: 10/21/2023 Time: 7:50 AM  Last Vitals:  Vitals:   10/20/23 2008 10/21/23 0056  BP: 107/89 112/75  Pulse: 76 63  Resp: 18 16  Temp: 36.9 C 36.8 C  SpO2: 100% 98%    Level of Consciousness: alert  Pain: none   Side Effects:None  Catheter Site Exam:clean, dry     Plan: D/C from anesthesia care at surgeon's request  Elmarie Mainland

## 2023-10-21 NOTE — Anesthesia Postprocedure Evaluation (Signed)
Anesthesia Post Note  Patient: Pamela Friedman. Redder  Procedure(s) Performed: CESAREAN SECTION  Patient location during evaluation: Mother Baby Anesthesia Type: Spinal Level of consciousness: oriented and awake and alert Pain management: pain level controlled Vital Signs Assessment: post-procedure vital signs reviewed and stable Respiratory status: spontaneous breathing and respiratory function stable Cardiovascular status: blood pressure returned to baseline and stable Postop Assessment: no headache, no backache, no apparent nausea or vomiting and able to ambulate Anesthetic complications: no  No notable events documented.   Last Vitals:  Vitals:   10/20/23 2008 10/21/23 0056  BP: 107/89 112/75  Pulse: 76 63  Resp: 18 16  Temp: 36.9 C 36.8 C  SpO2: 100% 98%    Last Pain:  Vitals:   10/20/23 2006  TempSrc:   PainSc: 0-No pain                 Anubis Fundora B Alonza Smoker

## 2023-10-21 NOTE — Progress Notes (Signed)
S: she denies any concerns, ambulating around the room, denies dizziness or SOB.  O:     Latest Ref Rng & Units 10/21/2023    5:46 AM 10/20/2023    5:36 AM 10/14/2023    4:40 PM  CBC  WBC 4.0 - 10.5 K/uL 13.1  15.0  12.4   Hemoglobin 12.0 - 15.0 g/dL 7.6  56.2  13.0   Hematocrit 36.0 - 46.0 % 22.7  35.7  33.6   Platelets 150 - 400 K/uL 357  471  555    Vitals:   10/20/23 2008 10/21/23 0056  BP: 107/89 112/75  Pulse: 76 63  Resp: 18 16  Temp: 98.4 F (36.9 C) 98.2 F (36.8 C)  SpO2: 100% 98%   I/O last 3 completed shifts: In: 1000 [I.V.:1000] Out: 1425 [Urine:975; Blood:450] No intake/output data recorded.  A: acute blood loss anemia, clinically significant  P: 1u PRBCs. She consented, consents signed.  CBC 4hrs post-transfusion.  Janyce Llanos, CNM 10/21/2023
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
## 2023-10-22 ENCOUNTER — Ambulatory Visit: Payer: MEDICAID

## 2023-10-22 LAB — CBC
HCT: 29 % — ABNORMAL LOW (ref 36.0–46.0)
Hemoglobin: 9.7 g/dL — ABNORMAL LOW (ref 12.0–15.0)
MCH: 29.4 pg (ref 26.0–34.0)
MCHC: 33.4 g/dL (ref 30.0–36.0)
MCV: 87.9 fL (ref 80.0–100.0)
Platelets: 431 10*3/uL — ABNORMAL HIGH (ref 150–400)
RBC: 3.3 MIL/uL — ABNORMAL LOW (ref 3.87–5.11)
RDW: 14.5 % (ref 11.5–15.5)
WBC: 12.2 10*3/uL — ABNORMAL HIGH (ref 4.0–10.5)
nRBC: 0 % (ref 0.0–0.2)

## 2023-10-22 LAB — TYPE AND SCREEN
ABO/RH(D): O POS
Antibody Screen: NEGATIVE
Unit division: 0

## 2023-10-22 LAB — BPAM RBC
Blood Product Expiration Date: 202412312359
ISSUE DATE / TIME: 202412030947
Unit Type and Rh: 5100

## 2023-10-22 MED ORDER — IBUPROFEN 600 MG PO TABS: 600.0000 mg | ORAL_TABLET | Freq: Four times a day (QID) | ORAL | 1 refills | Status: AC | PRN

## 2023-10-22 MED ORDER — ACETAMINOPHEN 325 MG PO TABS: 650.0000 mg | ORAL_TABLET | Freq: Four times a day (QID) | ORAL | Status: AC | PRN

## 2023-10-22 MED ORDER — SENNA 8.6 MG PO TABS: 1.0000 | ORAL_TABLET | Freq: Every day | ORAL | Status: AC

## 2023-10-22 MED ORDER — OXYCODONE HCL 5 MG PO TABS: 5.0000 mg | ORAL_TABLET | Freq: Four times a day (QID) | ORAL | 0 refills | Status: AC | PRN

## 2023-10-22 NOTE — Lactation Note (Signed)
This note was copied from a baby's chart. Lactation Consultation Note  Patient Name: Boy Sitlaly Panzer HQION'G Date: 10/22/2023 Age:22 hours Reason for consult: Follow-up assessment;Mother's request;Early term 37-38.6wks   Maternal Data Follow up assessment w/ a P2 patient and 11 hr old baby boy.  Patient called out for feeding assistance at 1020,  wanted to make sure latch was correct w/ infant.   Feeding Mother's Current Feeding Choice: Breast Milk and Formula  Patient placed infant to the rt breast in cradle position.  The initial positioning of infant at the breast needed some minor adjustments.  LC talked patient through facing infants tummy to moms tummy as well as supporting breast and infants head at the feeding.  Infant has a shallow latch that needs to have some work on.  He is not opening his mouth wide to take in more breast tissue.  LC made some minor adjustments at the breast and infant opened up a little more.  Patient verbalized that the latch wasn't painful and appreciated the new strategies to maximize his latch.    LATCH Score Latch: Grasps breast easily, tongue down, lips flanged, rhythmical sucking.  Audible Swallowing: Spontaneous and intermittent  Type of Nipple: Everted at rest and after stimulation  Comfort (Breast/Nipple): Soft / non-tender  Hold (Positioning): Assistance needed to correctly position infant at breast and maintain latch.  LATCH Score: 9   Lactation Tools Discussed/Used Provided Sore Nipple Soft Shells to patient and how to use and clean them.  Interventions Interventions: Assisted with latch;Adjust position;Education  Discharge Discharge Education: Engorgement and breast care;Outpatient recommendation  Education on engorgement prevention/treatment was discussed as well as breastmilk storage guidelines.  LC provided patient with a handout on breastmilk storage guidelines from [facility]. [facility] outpatient lactation services phone number  written on the white board in the room.  Patient verbalized understanding.  Consult Status Consult Status: Complete Follow-up type: Call as needed    Mariellen Blaney S Akil Hoos 10/22/2023, 11:30 AM

## 2023-10-22 NOTE — Progress Notes (Signed)
pts Edinburgh Depression score her survey seemed concerning at a score of 16.RN discussed with the pt about her feelings of post partum anxiety/post partum depression and baby blues. she mentioned she would be okay with starting some medication post partum to help with these symptoms since she has previous struggles with depression.RN sent message to CN Margaretmary Eddy about situation.

## 2023-10-22 NOTE — TOC Transition Note (Signed)
Transition of Care (TOC) - CM/SW Discharge Note   Patient Details  Name: Pamela Friedman MRN: [id number] Date of Birth: 01-07-2001  Transition of Care [facility]) CM/SW Contact:  Allena Katz, LCSW Phone Number: 10/22/2023, 9:55 AM   Clinical Narrative:  Pt discharging home. CPS notified.     Final next level of care: Home/Self Care Barriers to Discharge: Barriers Resolved   Patient Goals and CMS Choice      Discharge Placement                         Discharge Plan and Services Additional resources added to the After Visit Summary for                                       Social Determinants of Health (SDOH) Interventions SDOH Screenings   Food Insecurity: No Food Insecurity (10/20/2023)  Recent Concern: Food Insecurity - Food Insecurity Present (10/14/2023)  Housing: Low Risk  (10/20/2023)  Transportation Needs: Unmet Transportation Needs (10/20/2023)  Utilities: Not At Risk (10/20/2023)  Depression (PHQ2-9): Low Risk  (01/01/2022)  Recent Concern: Depression (PHQ2-9) - Medium Risk (10/03/2021)  Financial Resource Strain: Low Risk  (10/14/2023)  Tobacco Use: Medium Risk (10/20/2023)     Readmission Risk Interventions     No data to display

## 2023-10-22 NOTE — Progress Notes (Signed)
Postop Day  2  Subjective: 22 y.o. [id number] postpartum day #2 status post repeat cesarean section. Notified by primary RN about elevated EPDS score. Patient states that she has a long history of depression. Has tried counseling in the past but it has not been effective for her. States "I don't like talking to people." She has not tried medication in before. Reports PPD after her first delivery. States she "just dealt with it" and couldn't state if anything was helpful or not.  Denies SI/HI. Reports bonding well with baby.   Objective: BP 120/85 (BP Location: Left Arm)   Pulse 88   Temp 98.8 F (37.1 C) (Oral)   Resp 20   Ht 5' 3.5" (1.613 m)   Wt 58 kg   LMP 01/31/2023 (Within Days)   SpO2 100%   Breastfeeding Unknown   BMI 22.30 kg/m    Physical Exam:  General: alert, cooperative, and no distress Breasts: soft/nontender Pulm: nl effort  Recent Labs    10/21/23 1421 10/22/23 0515  HGB 9.6* 9.7*  HCT 28.2* 29.0*  WBC 13.0* 12.2*  PLT 410* 431*    Edinburgh Postnatal Depression Scale - 10/22/23 1203       Edinburgh Postnatal Depression Scale:  In the Past 7 Days   I have been able to laugh and see the funny side of things. 2    I have looked forward with enjoyment to things. 2    I have blamed myself unnecessarily when things went wrong. 2    I have been anxious or worried for no good reason. 2    I have felt scared or panicky for no good reason. 1    Things have been getting on top of me. 2    I have been so unhappy that I have had difficulty sleeping. 2    I have felt sad or miserable. 2    I have been so unhappy that I have been crying. 1    The thought of harming myself has occurred to me. 0    Edinburgh Postnatal Depression Scale Total 16              Assessment/Plan: 22 y.o. [id number] postpartum day # 2  1. Continue routine postpartum care. -Planning D/C today   2. Elevated EPDS screen -Bonding well with baby. Initiating feeds and responds quickly and  appropriately to infant cues.  -Discussed both counseling and medications for treatment of depression symptoms.  She does not desire counseling or therapy. She is unsure about medication.  -Will provide information on postpartum depression, resources, and medication (specifically Zoloft). Risk/benefits of medications reviewed including potential side effects. We also discussed the potential for rare but serious side effect of suicidal ideation. Reviewed that if she started medication and had this side effect she should notify us right away so that we can help change medication.  -Velera will let us know if she desires to start SSRI therapy. Will plan to recheck mood at [facility] appointment next week.  -Reviewed [facility] is the crisis center for [facility] and ED at [facility] available after hours.   Disposition: desires discharge home today    LOS: 2 days   Gustavo Lah, [place] 10/22/2023, 1:43 PM   ----- Margaretmary Eddy  Certified Nurse Midwife [facility]

## 2023-10-22 NOTE — Progress Notes (Signed)
Discharge instructions given and reviewed with pt and family. Pt educated on follow up care, appointments and when to notify provider, questions invited and answered. ID bands matched with infant.  Pt and family noted understanding  to teaching/education.Pt escorted off unit by volunteer in wheelchair with infant safely in  car seat and carried by grandmother of infant. Infant secured in car seat by family.

## 2023-10-23 ENCOUNTER — Encounter: Payer: Self-pay | Admitting: Advanced Practice Midwife

## 2023-10-23 ENCOUNTER — Telehealth: Payer: Self-pay

## 2023-10-23 DIAGNOSIS — N390 Urinary tract infection, site not specified: Secondary | ICD-10-CM | POA: Insufficient documentation

## 2023-10-23 LAB — QUANTIFERON-TB GOLD PLUS
QuantiFERON Mitogen Value: 0.72 [IU]/mL
QuantiFERON Nil Value: 0 [IU]/mL
QuantiFERON TB1 Ag Value: 0.01 [IU]/mL
QuantiFERON TB2 Ag Value: 0.01 [IU]/mL
QuantiFERON-TB Gold Plus: NEGATIVE

## 2023-10-23 LAB — TOXASSURE FLEX 15, UR
6-ACETYLMORPHINE IA: NEGATIVE ng/mL
7-aminoclonazepam: NOT DETECTED ng/mg{creat}
AMPHETAMINES IA: NEGATIVE ng/mL
Alpha-hydroxyalprazolam: NOT DETECTED ng/mg{creat}
Alpha-hydroxymidazolam: NOT DETECTED ng/mg{creat}
Alpha-hydroxytriazolam: NOT DETECTED ng/mg{creat}
Alprazolam: NOT DETECTED ng/mg{creat}
BARBITURATES IA: NEGATIVE ng/mL
BUPRENORPHINE: NEGATIVE
Benzodiazepines: NEGATIVE
Buprenorphine: NOT DETECTED ng/mg{creat}
COCAINE METABOLITE IA: NEGATIVE ng/mL
Clonazepam: NOT DETECTED ng/mg{creat}
Creatinine: 119 mg/dL
Desalkylflurazepam: NOT DETECTED ng/mg{creat}
Desmethyldiazepam: NOT DETECTED ng/mg{creat}
Desmethylflunitrazepam: NOT DETECTED ng/mg{creat}
Diazepam: NOT DETECTED ng/mg{creat}
ETHYL ALCOHOL Enzymatic: NEGATIVE g/dL
FENTANYL: NEGATIVE
Fentanyl: NOT DETECTED ng/mg{creat}
Flunitrazepam: NOT DETECTED ng/mg{creat}
Lorazepam: NOT DETECTED ng/mg{creat}
METHADONE IA: NEGATIVE ng/mL
METHADONE MTB IA: NEGATIVE ng/mL
Midazolam: NOT DETECTED ng/mg{creat}
Norbuprenorphine: NOT DETECTED ng/mg{creat}
Norfentanyl: NOT DETECTED ng/mg{creat}
OPIATE CLASS IA: NEGATIVE ng/mL
OXYCODONE CLASS IA: NEGATIVE ng/mL
Oxazepam: NOT DETECTED ng/mg{creat}
PHENCYCLIDINE IA: NEGATIVE ng/mL
TAPENTADOL, IA: NEGATIVE ng/mL
TRAMADOL IA: NEGATIVE ng/mL
Temazepam: NOT DETECTED ng/mg{creat}

## 2023-10-23 LAB — PREGNANCY, INITIAL SCREEN
Antibody Screen: NEGATIVE
Basophils Absolute: 0 10*3/uL (ref 0.0–0.2)
Basos: 0 %
Bilirubin, UA: NEGATIVE
Chlamydia trachomatis, NAA: NEGATIVE
EOS (ABSOLUTE): 0.1 10*3/uL (ref 0.0–0.4)
Eos: 1 %
Glucose, UA: NEGATIVE
HCV Ab: NONREACTIVE
HIV Screen 4th Generation wRfx: NONREACTIVE
Hematocrit: 34.3 % (ref 34.0–46.6)
Hemoglobin: 10.8 g/dL — ABNORMAL LOW (ref 11.1–15.9)
Hepatitis B Surface Ag: NEGATIVE
Immature Grans (Abs): 0.3 10*3/uL — ABNORMAL HIGH (ref 0.0–0.1)
Immature Granulocytes: 3 %
Lymphocytes Absolute: 1.3 10*3/uL (ref 0.7–3.1)
Lymphs: 10 %
MCH: 28.8 pg (ref 26.6–33.0)
MCHC: 31.5 g/dL (ref 31.5–35.7)
MCV: 92 fL (ref 79–97)
Monocytes Absolute: 0.8 10*3/uL (ref 0.1–0.9)
Monocytes: 7 %
Neisseria Gonorrhoeae by PCR: NEGATIVE
Neutrophils Absolute: 10.2 10*3/uL — ABNORMAL HIGH (ref 1.4–7.0)
Neutrophils: 79 %
Nitrite, UA: NEGATIVE
Platelets: 560 10*3/uL — ABNORMAL HIGH (ref 150–450)
RBC: 3.75 x10E6/uL — ABNORMAL LOW (ref 3.77–5.28)
RDW: 13.2 % (ref 11.7–15.4)
RPR Ser Ql: NONREACTIVE
Rh Factor: POSITIVE
Rubella Antibodies, IGG: 4.95 {index} (ref >0.99)
Specific Gravity, UA: 1.025 (ref 1.005–1.030)
Urobilinogen, Ur: 0.2 mg/dL (ref 0.2–1.0)
WBC: 12.7 10*3/uL — ABNORMAL HIGH (ref 3.4–10.8)
pH, UA: 6 (ref 5.0–7.5)

## 2023-10-23 LAB — MICROSCOPIC EXAMINATION
Casts: NONE SEEN /[LPF]
WBC, UA: >30 /[HPF] — AB (ref 0–5)

## 2023-10-23 LAB — MATERNIT 21 PLUS CORE, BLOOD
Fetal Fraction: 50
Result (T21): NEGATIVE
Trisomy 13 (Patau syndrome): NEGATIVE
Trisomy 18 (Edwards syndrome): NEGATIVE
Trisomy 21 (Down syndrome): NEGATIVE

## 2023-10-23 LAB — VARICELLA ZOSTER ANTIBODY, IGG: Varicella zoster IgG: REACTIVE

## 2023-10-23 LAB — CANNABINOIDS, MS, UR RFX
Cannabinoids Confirmation: POSITIVE
Carboxy-THC: >840 ng/mg{creat}

## 2023-10-23 LAB — HCV INTERPRETATION

## 2023-10-23 LAB — URINE CULTURE, OB REFLEX

## 2023-10-23 NOTE — Telephone Encounter (Signed)
TC to patient who gave birth on 10/20/23. Patient scheduled for 2 week mood check and C-section incision check on 11/03/23. Patient states she is doing well. Per new OB labs results which arrived today patient has UTI. Medication prescription was done at [facility] and patient states she will have someone pick it up for her asap. Patient will need urine TOC at upcoming appointment if she has completed her medication.Burt Knack, RN

## 2023-10-26 NOTE — Progress Notes (Signed)
 Obstetrics and Gynecology Clinic Postpartum Post-Op 10/27/2023   HISTORY HPI: Pamela Friedman is a 22 y.o. G2P2002 s/p RLTCS on 10/20/23 at [redacted]w[redacted]d after presenting in labor who presents today for post op incision check and mood check.     Pregnancy was complicated by: THC use UTI in pregnancy Depression Limited PNC Anemia Hx of c/s   Delivery was uncomplicated. She was discharged home on POD2. Her EPDS on day of discharge was elevated (16). Patient declined counseling and was thinking about medication.   Since delivery, she has been feeling well. Baby is doing well. She is formula feeding without issues. She desires an IUD for contraception. She reports her mood is ehh. Reports feeling overwhelmed and endorses feeling down and depression sometimes. Reports hx of depression and this is the same but just more intense. Denies SI/HI. Lives with FOB and FOB's mother and her kids. Has good support system. Lochia is light. Reports mild abdominal pain that ibuprofen  helps with. Denies nausea, vomiting, fevers, CP, SOB. No problems with incision.       No Known Allergies   Prior to Admission medications   Medication Sig Taking? Last Dose  acetaminophen  (TYLENOL ) 325 MG tablet Take 650 mg by mouth Yes Taking  ferrous sulfate  325 (65 FE) MG tablet Take 1 tablet by mouth Yes Taking  ibuprofen  (MOTRIN ) 600 MG tablet Take 600 mg by mouth Yes Taking  oxyCODONE  (ROXICODONE ) 5 MG immediate release tablet Take 5 mg by mouth Yes Taking     No past medical history on file.     PHYSICAL EXAM BP 112/62   Ht 160 cm (5' 3)   Wt 51 kg (112 lb 6.4 oz)   Breastfeeding No   BMI 19.91 kg/m    General: Alert, NAD HEENT: MMM.  CV: Regular rate Resp: No increased work of breathing Abd: Nontender to palpation Incision: Honeycomb removed, c/d/I, no induration, erythema, or drainage Ext: No peripheral edema. Psych: Appropriate affect.      ASSESSMENT/PLAN: Pamela Friedman is a 22 y.o. G2P2002  s/p RLTCS on 10/20/23 at [redacted]w[redacted]d after presenting in labor who presents today for post op incision check and mood check.   #Wound check - Incision healing well without evidence of infection   #Contraception: Plans for IUD - Plans to see Felicia at 6 weeks postpartum for insertion.   #Anemia - Hb 11.6 on admission > c/s > 7.6 POD1 > 1 u pRBC + IV iron  > 9.7 on discharge - Continue PO iron  MWF  #Depression - Hx of depression and reports worsening of mood since delivery - EPDS 16 on discharge and 14 today - Patient shows signs of postpartum depression. Offered assistance with medication and counseling and patient declined.  - Provided [place] [facility] website and resources   F/u: RTC in 3 weeks for post-op visit    Electronically signed: BEVERLI DICIE DINSMORE, MD, 10/27/2023, 2:44 PM

## 2023-10-27 ENCOUNTER — Telehealth: Payer: Self-pay | Admitting: Licensed Clinical Social Worker

## 2023-10-27 NOTE — Telephone Encounter (Signed)
-----   Message from Nurse Katie B sent at 10/23/2023 12:13 PM EST ----- Regarding: delivered This client delivered on 10/20/23 and has mental health issues on her problem list. Her Inocente Salles at the hospital was 16.

## 2023-10-29 NOTE — Telephone Encounter (Signed)
TELEHEALTH VIRTUAL MOOD VISIT ENCOUNTER NOTE  I connected with Pamela Friedman on 10/29/23  by telephone and verified that I am speaking with the correct person using two identifiers.   I discussed the limitations, risks, security and privacy concerns of performing an evaluation and management service by telephone and the availability of in person appointments. I also discussed with the patient that there may be a patient responsible charge related to this service. The patient expressed understanding and agreed to proceed.  LCSW introduced self and established psychological safety.  Patient verbally consented to this telephonic session.   Patient is located at home LCSW located at [facility]  Time visit started: 3:13pm  Time visit ended: 341pm   SUBJECTIVE: Patient clinic or provider recommended a 2 week mood check due to risk factors for postpartum mood disorder.  Patient delivered on 10/20/23.    ASSESSMENT: Patient currently experiencing depressed mood and anxiety EPDS = 16. Patient reports feeling tired, not sleeping a lot, even when baby is asleep - sleeping 3 hours a day. She describes not having a lot of opportunity to sleep, but also shares she has difficulties sleeping due to feeling anxious and worrying about baby. Patient also reports low appetite. Patient is currently bottle feeding.   Patient reports that she had postpartum depression after her now 69 month old daughter. She shares that she has felt depressed and anxious since she was 22yo and feels it has only slightly increased since having baby. She reports she is just use to dealing with it. Patient reports that she tried therapy before but did not feel benefit. She denies she has been hospitalized or ever been on medication.    Patient reports that she lives with boyfriend of five years and his mom and feels supported by them.   Patient may benefit from Psychiatric medication evaluation and outpatient therapy. LCSW encouraged  patient to consider each and provided contact information for her to schedule with LCSW at [facility], with [facility] and provided 988 crisis life line.   PRESENTING CONCERNS: Patient and/or family reports the following symptoms/concerns: use to feeling depressed  Duration of problem: since she was 22yo a little more now; Severity of problem: moderate  STRENGTHS (Protective Factors/Coping Skills): Supportive boyfriend and her mom  And sometimes her kids help her to be strong   INTERVENTIONS: Interventions utilized:  Sleep Hygiene, Psychoeducation and/or Health Education, Link to [employer], and Client Centered  Standardized Assessments completed: Inocente Salles Postnatal Depression  Conducted brief assessment  Provide brief psychoeducation on postpartum mood and anxiety disorders signs and symptoms, including information on [employer], Postpartum Depression, and Postpartum Anxiety.   Discussed self-care strategies to prevent and/or reduce symptoms of postpartum mood and anxiety disorders, including sleep hygiene, eating regularly, drinking fluids, getting outside, and support from partner, family, and/or friends.   Informed patient to call her provider right away or get emergency help if she experiences any of the following symptoms: Feelings of hopelessness and total despair. Feeling out of touch with reality (hearing or seeing things other people don't). Feeling that you might hurt yourself or your baby.  PLAN: Patient voiced understanding of information above and shared that she will work on increasing protein intake by eating some every two hours and agrees to go outside daily when the sun is out.  Behavioral recommendations: Patient may benefit from Psychiatric medication evaluation and outpatient therapy. LCSW encouraged patient to consider each and provided contact information for her to schedule with LCSW  at [facility], and with [facility] and provided 988 crisis  life line.  Patient is in agreement for LCSW to follow up in 3-4 weeks.   Kathreen Cosier

## 2023-11-04 ENCOUNTER — Ambulatory Visit: Payer: MEDICAID | Admitting: Advanced Practice Midwife

## 2023-11-04 DIAGNOSIS — Z8659 Personal history of other mental and behavioral disorders: Secondary | ICD-10-CM | POA: Diagnosis not present

## 2023-11-04 DIAGNOSIS — Z98891 History of uterine scar from previous surgery: Secondary | ICD-10-CM | POA: Diagnosis not present

## 2023-11-04 DIAGNOSIS — Z7289 Other problems related to lifestyle: Secondary | ICD-10-CM | POA: Diagnosis not present

## 2023-11-04 NOTE — Progress Notes (Signed)
S: 22 yo SBF G75P61 (40 month old) here with infant and her mom for a mental health check. Repeat c/s at 37 3/7 M 5#15 with pp hemorrhage EBL: 400 cc and 1UPRBC given. Bottlefeeding q 3 hours (3 oz). [facility] 5#5 at [facility] and second time weighed 5#11. Mom is helping her with kids. Taking FeSo4 I daily and has lochia rubra. Pt has not had sex. +cry qo day, poor sleep, poor appetite, +anhedonia, low energy level, +moody, irritable, SI 2 wks ago with no plan. PHQ-9=16 O:  102/63, 97.6 A/P: has pp apt at [facility] 11/18/23 and intends to have IUD placed. Pt agrees to call Kathreen Cosier, LCSW for counseling; contact info given and referral made.

## 2023-11-04 NOTE — Progress Notes (Signed)
Patient states has postpartum visit scheduled at [facility] . BP= 102/63, 71 HR, 97.6 temp weight 113.4 lbs.BTHIELE RN

## 2023-11-05 NOTE — Addendum Note (Signed)
Addended by: Heywood Bene on: 11/05/2023 02:40 PM   Modules accepted: Orders

## 2023-11-20 ENCOUNTER — Ambulatory Visit: Payer: MEDICAID | Admitting: Licensed Clinical Social Worker
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
## 2024-02-02 ENCOUNTER — Encounter: Payer: Self-pay | Admitting: Nurse Practitioner

## 2024-02-02 ENCOUNTER — Ambulatory Visit: Payer: MEDICAID | Admitting: Nurse Practitioner

## 2024-02-02 DIAGNOSIS — Z3009 Encounter for other general counseling and advice on contraception: Secondary | ICD-10-CM

## 2024-02-02 MED ORDER — LEVONORGESTREL 1.5 MG PO TABS: 1.5000 mg | ORAL_TABLET | Freq: Once | ORAL | 0 refills | Status: AC

## 2024-02-02 MED ORDER — NORETHINDRONE 0.35 MG PO TABS: 1.0000 | ORAL_TABLET | Freq: Every day | ORAL | 13 refills | Status: AC

## 2024-02-02 NOTE — Progress Notes (Unsigned)
 Pt here for PP exam and birth control.  Declines STI screening.  Declines condoms.  Rx for oral contraceptive pills and emergency contraception sent to pt's desired pharmacy for pick up.  ECP consent signed and instruction sheet provided.  Family planning packet provided.-Collins Scotland, RN
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
## 2024-02-04 ENCOUNTER — Encounter: Payer: Self-pay | Admitting: Nurse Practitioner

## 2024-02-09 ENCOUNTER — Telehealth: Payer: Self-pay | Admitting: Nurse Practitioner

## 2024-02-09 NOTE — Telephone Encounter (Signed)
 Called pt to discuss [facility] referral. Per [facility] policy pt must call herself. Pt given # to clinic & advised she would call. Pt reported she was feeling fine today & there was nothing further I could assist her with at this time. Call ended.  Marylu Lund L. Rayshad Riviello, FNP-C

## 2024-04-09 ENCOUNTER — Telehealth: Payer: Self-pay | Admitting: Family Medicine

## 2024-04-09 NOTE — Telephone Encounter (Signed)
 Patient called in asking if she could receive a refill of her BC pills

## 2024-04-09 NOTE — Telephone Encounter (Signed)
 Call to client and counseled that when at 02/02/24 post-partum appt at [facility], ocp was ordered along with refills to her [facility] pharmacy. Per client, started ocps on 02/03/24. States last ocp was yesterday. Client reports her daughter lost her last pack of ocps that were unopened and still in the brown bag. Client counseled to call [facility] to notify them of above and obtain another pack of ocps. Counseled client that this RN did not know if Medicaid would cover another pack and to ask pharmacist. Counseled to always keep all medications out of the reach of children and she verbalized understanding. Ariel Begun, RN

## 2024-12-03 ENCOUNTER — Ambulatory Visit: Payer: MEDICAID

## 2024-12-13 ENCOUNTER — Ambulatory Visit: Payer: MEDICAID

## 2024-12-14 ENCOUNTER — Ambulatory Visit: Payer: MEDICAID
